# Patient Record
Sex: Female | Born: 1975 | Race: White | Hispanic: No | Marital: Married | Smoking: Former smoker
Health system: Southern US, Community
[De-identification: ages and names within clinical notes are randomized; demographics above are authoritative.]

## PROBLEM LIST (undated history)

## (undated) ENCOUNTER — Inpatient Hospital Stay (HOSPITAL_COMMUNITY): Payer: Self-pay

## (undated) DIAGNOSIS — T7840XA Allergy, unspecified, initial encounter: Secondary | ICD-10-CM

## (undated) DIAGNOSIS — R0602 Shortness of breath: Secondary | ICD-10-CM

## (undated) DIAGNOSIS — R011 Cardiac murmur, unspecified: Secondary | ICD-10-CM

## (undated) DIAGNOSIS — J45909 Unspecified asthma, uncomplicated: Secondary | ICD-10-CM

## (undated) DIAGNOSIS — F419 Anxiety disorder, unspecified: Secondary | ICD-10-CM

## (undated) HISTORY — DX: Allergy, unspecified, initial encounter: T78.40XA

## (undated) HISTORY — DX: Anxiety disorder, unspecified: F41.9

---

## 2009-08-19 ENCOUNTER — Inpatient Hospital Stay (HOSPITAL_COMMUNITY): Admission: AD | Admit: 2009-08-19 | Discharge: 2009-08-22 | Payer: Self-pay | Admitting: Obstetrics and Gynecology

## 2010-05-25 ENCOUNTER — Encounter: Admission: RE | Admit: 2010-05-25 | Discharge: 2010-05-25 | Payer: Self-pay | Admitting: Internal Medicine

## 2011-02-25 LAB — CBC
HCT: 30.6 % — ABNORMAL LOW (ref 36.0–46.0)
Hemoglobin: 10.5 g/dL — ABNORMAL LOW (ref 12.0–15.0)
MCHC: 33.8 g/dL (ref 30.0–36.0)
MCHC: 34.3 g/dL (ref 30.0–36.0)
MCV: 94.8 fL (ref 78.0–100.0)
Platelets: 121 10*3/uL — ABNORMAL LOW (ref 150–400)
Platelets: 166 10*3/uL (ref 150–400)
RDW: 13.3 % (ref 11.5–15.5)
WBC: 10.7 10*3/uL — ABNORMAL HIGH (ref 4.0–10.5)

## 2012-03-08 LAB — OB RESULTS CONSOLE HEPATITIS B SURFACE ANTIGEN: Hepatitis B Surface Ag: NEGATIVE

## 2012-03-08 LAB — OB RESULTS CONSOLE HIV ANTIBODY (ROUTINE TESTING): HIV: NONREACTIVE

## 2012-03-08 LAB — OB RESULTS CONSOLE RUBELLA ANTIBODY, IGM: Rubella: IMMUNE

## 2012-03-08 LAB — OB RESULTS CONSOLE RPR: RPR: NONREACTIVE

## 2012-03-08 LAB — OB RESULTS CONSOLE GC/CHLAMYDIA: Chlamydia: NEGATIVE

## 2012-08-21 ENCOUNTER — Other Ambulatory Visit (HOSPITAL_COMMUNITY): Payer: Self-pay | Admitting: Obstetrics and Gynecology

## 2012-08-21 ENCOUNTER — Encounter (HOSPITAL_COMMUNITY): Payer: Self-pay

## 2012-08-21 ENCOUNTER — Ambulatory Visit (HOSPITAL_COMMUNITY)
Admission: RE | Admit: 2012-08-21 | Discharge: 2012-08-21 | Disposition: A | Discharge status: Home / Self Care | Payer: PRIVATE HEALTH INSURANCE | Source: Ambulatory Visit | Attending: Obstetrics and Gynecology | Admitting: Obstetrics and Gynecology

## 2012-08-21 DIAGNOSIS — R059 Cough, unspecified: Secondary | ICD-10-CM | POA: Insufficient documentation

## 2012-08-21 DIAGNOSIS — R05 Cough: Secondary | ICD-10-CM

## 2012-08-22 ENCOUNTER — Inpatient Hospital Stay (HOSPITAL_COMMUNITY)
Admission: AD | Admit: 2012-08-22 | Discharge: 2012-08-22 | Disposition: A | Discharge status: Home / Self Care | Payer: PRIVATE HEALTH INSURANCE | Source: Ambulatory Visit | Attending: Obstetrics and Gynecology | Admitting: Obstetrics and Gynecology

## 2012-08-22 ENCOUNTER — Encounter (HOSPITAL_COMMUNITY): Payer: Self-pay | Admitting: *Deleted

## 2012-08-22 DIAGNOSIS — J209 Acute bronchitis, unspecified: Secondary | ICD-10-CM | POA: Insufficient documentation

## 2012-08-22 DIAGNOSIS — R059 Cough, unspecified: Secondary | ICD-10-CM | POA: Insufficient documentation

## 2012-08-22 DIAGNOSIS — R05 Cough: Secondary | ICD-10-CM | POA: Insufficient documentation

## 2012-08-22 DIAGNOSIS — O99891 Other specified diseases and conditions complicating pregnancy: Secondary | ICD-10-CM | POA: Insufficient documentation

## 2012-08-22 DIAGNOSIS — R0602 Shortness of breath: Secondary | ICD-10-CM | POA: Insufficient documentation

## 2012-08-22 MED ORDER — BENZONATATE 200 MG PO CAPS: 200.0000 mg | ORAL_CAPSULE | Freq: Three times a day (TID) | ORAL | Status: DC | PRN

## 2012-08-22 NOTE — MAU Provider Note (Signed)
Chief Complaint:  Cough   First Provider Initiated Contact with Patient 08/22/12 2232      HPI: Alyssa Perez is a 36 y.o. G3P1011 at 5w2dwho presents to maternity admissions reporting shortness of breath. She's had URI for about a month and coughing continuously for about 2 weeks. Daughter also has cough for a month. She was initially seen at Urgent Care 4 days ago and started on Z-pack which she is taking as directed. She had a prenatal visit yesterday and was sent over here for chest x-ray which was negative yesterday. She denies fever chills or chest pain. Cough is nonproductive. Took cough suppressant only once because it makes her sleepy. Denies contractions, leakage of fluid or vaginal bleeding. Good fetal movement.   Pregnancy Course: Essentially uncomplicated  Past Medical History: History reviewed. No pertinent past medical history.  Past obstetric history: OB History    Grav Para Term Preterm Abortions TAB SAB Ect Mult Living   3 1 1  0 1 0 1 0 0 1     # Outc Date GA Lbr Len/2nd Wgt Sex Del Anes PTL Lv   1 TRM 9/10   6lb10oz(3.005kg) F LTCS EPI No Yes   2 SAB            3 CUR               Past Surgical History: Past Surgical History  Procedure Date  . Cesarean section     Family History: History reviewed. No pertinent family history.  Social History: History  Substance Use Topics  . Smoking status: Never Smoker   . Smokeless tobacco: Not on file  . Alcohol Use: No    Allergies:  Allergies  Allergen Reactions  . Sulfa Antibiotics Other (See Comments)    Patient does not recall reaction, but states that "it put her in hospital."  . Penicillins Rash    Meds:  Prescriptions prior to admission  Medication Sig Dispense Refill  . azithromycin (ZITHROMAX) 250 MG tablet Take 250 mg by mouth daily.      . Prenatal Vit-Fe Fumarate-FA (PRENATAL MULTIVITAMIN) TABS Take 1 tablet by mouth at bedtime.      . promethazine-codeine (PHENERGAN WITH CODEINE) 6.25-10  MG/5ML syrup Take 5 mLs by mouth every 4 (four) hours as needed. For cough        ROS: Pertinent findings in history of present illness.  Physical Exam  Blood pressure 109/63, pulse 95, temperature 98.5 F (36.9 C), temperature source Oral, resp. rate 20, height 5\' 5"  (1.651 m), weight 73.936 kg (163 lb), SpO2 99.00%. GENERAL: Well-developed, well-nourished female in no acute distress.  HEENT: normocephalic HEART: RRR, no murmur RESP: normal effort, CTA bilaterally ABDOMEN: Soft, non-tender, gravid appropriate for gestational age EXTREMITIES: Nontender, no edema NEURO: alert and oriented  FHT:  Baseline 135 , moderate variability, accelerations present, no decelerations Contractions: irreg, mild   Labs: No results found for this or any previous visit (from the past 24 hour(s)).No results found for this or any previous visit (from the past 72 hour(s)).  Imaging:  Dg Chest 2 View  08/21/2012  *RADIOLOGY REPORT*  Clinical Data: Chronic cough  CHEST - 2 VIEW  Comparison: None.  Findings: There is no edema or consolidation.  The heart size and pulmonary vascularity are normal.  No adenopathy.  No bone lesions.  IMPRESSION: No edema or consolidation.   Original Report Authenticated By: Arvin Collard. WOODRUFF III, M.D.       Assessment: 1. Bronchitis,  acute   G3P1011 at [redacted]w[redacted]d  Plan: C/W Dr. Henderson Cloud. Add Tessalon and discharge home with reassurance. Increase rest and fluids.  F/U in office as scheduled and prn.     Medication List     As of 08/22/2012 10:53 PM    STOP taking these medications         promethazine-codeine 6.25-10 MG/5ML syrup   Commonly known as: PHENERGAN with CODEINE      TAKE these medications         azithromycin 250 MG tablet   Commonly known as: ZITHROMAX   Take 250 mg by mouth daily.      benzonatate 200 MG capsule   Commonly known as: TESSALON   Take 1 capsule (200 mg total) by mouth 3 (three) times daily as needed for cough.      prenatal  multivitamin Tabs   Take 1 tablet by mouth at bedtime.         Danae Orleans, CNM 08/22/2012 10:37 PM

## 2012-08-22 NOTE — MAU Note (Signed)
Pt reports she had cold symptoms about 3 weeks ago, started coughing 2 weeks ago, mostly dry cough. Over last few days coughing has increased. Had a chest x-ray yesterday, was told it was "clear", is on day 4 of a Z-Pack. Denies fever.

## 2012-09-11 ENCOUNTER — Encounter (HOSPITAL_COMMUNITY): Payer: Self-pay | Admitting: Pharmacist

## 2012-09-20 ENCOUNTER — Encounter (HOSPITAL_COMMUNITY): Payer: Self-pay

## 2012-09-20 ENCOUNTER — Encounter (HOSPITAL_COMMUNITY)
Admission: RE | Admit: 2012-09-20 | Discharge: 2012-09-20 | Disposition: A | Discharge status: Home / Self Care | Payer: PRIVATE HEALTH INSURANCE | Source: Ambulatory Visit | Attending: Obstetrics and Gynecology | Admitting: Obstetrics and Gynecology

## 2012-09-20 HISTORY — DX: Shortness of breath: R06.02

## 2012-09-20 HISTORY — DX: Cardiac murmur, unspecified: R01.1

## 2012-09-20 LAB — SURGICAL PCR SCREEN: Staphylococcus aureus: NEGATIVE

## 2012-09-20 LAB — CBC
HCT: 34.7 % — ABNORMAL LOW (ref 36.0–46.0)
Hemoglobin: 11.6 g/dL — ABNORMAL LOW (ref 12.0–15.0)
MCH: 31 pg (ref 26.0–34.0)
Platelets: 146 10*3/uL — ABNORMAL LOW (ref 150–400)
RBC: 3.74 MIL/uL — ABNORMAL LOW (ref 3.87–5.11)

## 2012-09-20 LAB — RPR: RPR Ser Ql: NONREACTIVE

## 2012-09-20 LAB — TYPE AND SCREEN: ABO/RH(D): O POS

## 2012-09-20 NOTE — Patient Instructions (Addendum)
Your procedure is scheduled on:10/04/12  Enter through the Main Entrance at :12noon Pick up desk phone and dial [phone] and inform us of your arrival.  Please call [phone] if you have any problems the morning of surgery.  Remember: Do not eat after midnight: Wed. Clear liquids ok until 0930 am Thursday   Take these meds the morning of surgery with a sip of water:none  DO NOT wear jewelry, eye make-up, lipstick,body lotion, or dark fingernail polish. Do not shave for 48 hours prior to surgery.  If you are to be admitted after surgery, leave suitcase in car until your room has been assigned. Patients discharged on the day of surgery will not be allowed to drive home.

## 2012-09-25 ENCOUNTER — Encounter (HOSPITAL_COMMUNITY): Payer: Self-pay | Admitting: *Deleted

## 2012-09-25 ENCOUNTER — Inpatient Hospital Stay (HOSPITAL_COMMUNITY): Payer: PRIVATE HEALTH INSURANCE | Admitting: Anesthesiology

## 2012-09-25 ENCOUNTER — Encounter (HOSPITAL_COMMUNITY): Payer: Self-pay | Admitting: Anesthesiology

## 2012-09-25 ENCOUNTER — Inpatient Hospital Stay (HOSPITAL_COMMUNITY)
Admission: AD | Admit: 2012-09-25 | Discharge: 2012-09-27 | DRG: 775 | Disposition: A | Discharge status: Home / Self Care | Payer: PRIVATE HEALTH INSURANCE | Source: Ambulatory Visit | Attending: Obstetrics and Gynecology | Admitting: Obstetrics and Gynecology

## 2012-09-25 DIAGNOSIS — Z22330 Carrier of Group B streptococcus: Secondary | ICD-10-CM

## 2012-09-25 DIAGNOSIS — Z01812 Encounter for preprocedural laboratory examination: Secondary | ICD-10-CM

## 2012-09-25 DIAGNOSIS — O99892 Other specified diseases and conditions complicating childbirth: Secondary | ICD-10-CM | POA: Diagnosis present

## 2012-09-25 DIAGNOSIS — O34219 Maternal care for unspecified type scar from previous cesarean delivery: Secondary | ICD-10-CM

## 2012-09-25 LAB — CBC
HCT: 36.2 % (ref 36.0–46.0)
MCV: 90.5 fL (ref 78.0–100.0)
Platelets: 159 10*3/uL (ref 150–400)
RBC: 4 MIL/uL (ref 3.87–5.11)
RDW: 13.6 % (ref 11.5–15.5)
WBC: 17.7 10*3/uL — ABNORMAL HIGH (ref 4.0–10.5)

## 2012-09-25 MED ORDER — IBUPROFEN 600 MG PO TABS
600.0000 mg | ORAL_TABLET | Freq: Four times a day (QID) | ORAL | Status: DC | PRN
  Administered 2012-09-26: 600 mg via ORAL
  Filled 2012-09-25: qty 1

## 2012-09-25 MED ORDER — OXYCODONE-ACETAMINOPHEN 5-325 MG PO TABS: 1.0000 | ORAL_TABLET | ORAL | Status: DC | PRN

## 2012-09-25 MED ORDER — PENICILLIN G POTASSIUM 5000000 UNITS IJ SOLR: 5.0000 10*6.[IU] | Freq: Once | INTRAVENOUS | Status: DC

## 2012-09-25 MED ORDER — LIDOCAINE HCL (PF) 1 % IJ SOLN
30.0000 mL | INTRAMUSCULAR | Status: DC | PRN
  Filled 2012-09-25: qty 30

## 2012-09-25 MED ORDER — CITRIC ACID-SODIUM CITRATE 334-500 MG/5ML PO SOLN: 30.0000 mL | ORAL | Status: DC | PRN

## 2012-09-25 MED ORDER — VANCOMYCIN HCL IN DEXTROSE 1-5 GM/200ML-% IV SOLN
1000.0000 mg | Freq: Two times a day (BID) | INTRAVENOUS | Status: DC
  Filled 2012-09-25 (×2): qty 200

## 2012-09-25 MED ORDER — LACTATED RINGERS IV SOLN
INTRAVENOUS | Status: DC
  Administered 2012-09-25: 22:00:00 via INTRAVENOUS
  Administered 2012-09-25: 125 mL/h via INTRAVENOUS

## 2012-09-25 MED ORDER — OXYTOCIN 40 UNITS IN LACTATED RINGERS INFUSION - SIMPLE MED
62.5000 mL/h | INTRAVENOUS | Status: DC
  Filled 2012-09-25: qty 1000

## 2012-09-25 MED ORDER — PHENYLEPHRINE 40 MCG/ML (10ML) SYRINGE FOR IV PUSH (FOR BLOOD PRESSURE SUPPORT)
80.0000 ug | PREFILLED_SYRINGE | INTRAVENOUS | Status: DC | PRN
  Filled 2012-09-25: qty 5

## 2012-09-25 MED ORDER — ONDANSETRON HCL 4 MG/2ML IJ SOLN
4.0000 mg | Freq: Four times a day (QID) | INTRAMUSCULAR | Status: DC | PRN
  Administered 2012-09-25: 4 mg via INTRAVENOUS
  Filled 2012-09-25: qty 2

## 2012-09-25 MED ORDER — FENTANYL 2.5 MCG/ML BUPIVACAINE 1/10 % EPIDURAL INFUSION (WH - ANES)
14.0000 mL/h | INTRAMUSCULAR | Status: DC
  Administered 2012-09-25 (×2): 14 mL/h via EPIDURAL
  Filled 2012-09-25 (×2): qty 125

## 2012-09-25 MED ORDER — PHENYLEPHRINE 40 MCG/ML (10ML) SYRINGE FOR IV PUSH (FOR BLOOD PRESSURE SUPPORT): 80.0000 ug | PREFILLED_SYRINGE | INTRAVENOUS | Status: DC | PRN

## 2012-09-25 MED ORDER — CEFAZOLIN SODIUM 1-5 GM-% IV SOLN
1.0000 g | Freq: Three times a day (TID) | INTRAVENOUS | Status: DC
  Administered 2012-09-25: 1 g via INTRAVENOUS
  Filled 2012-09-25 (×3): qty 50

## 2012-09-25 MED ORDER — LIDOCAINE HCL (PF) 1 % IJ SOLN
INTRAMUSCULAR | Status: DC | PRN
  Administered 2012-09-25 (×2): 5 mL

## 2012-09-25 MED ORDER — VANCOMYCIN HCL 500 MG IV SOLR: 500.0000 mg | Freq: Two times a day (BID) | INTRAVENOUS | Status: DC

## 2012-09-25 MED ORDER — PENICILLIN G POTASSIUM 5000000 UNITS IJ SOLR: 2.5000 10*6.[IU] | INTRAVENOUS | Status: DC

## 2012-09-25 MED ORDER — CEFAZOLIN SODIUM-DEXTROSE 2-3 GM-% IV SOLR
2.0000 g | Freq: Once | INTRAVENOUS | Status: AC
  Administered 2012-09-25: 2 g via INTRAVENOUS
  Filled 2012-09-25: qty 50

## 2012-09-25 MED ORDER — LACTATED RINGERS IV SOLN: 500.0000 mL | INTRAVENOUS | Status: DC | PRN

## 2012-09-25 MED ORDER — OXYTOCIN BOLUS FROM INFUSION
500.0000 mL | INTRAVENOUS | Status: DC
  Administered 2012-09-25: 500 mL via INTRAVENOUS

## 2012-09-25 MED ORDER — LACTATED RINGERS IV SOLN
500.0000 mL | Freq: Once | INTRAVENOUS | Status: AC
  Administered 2012-09-25: 500 mL via INTRAVENOUS

## 2012-09-25 MED ORDER — ACETAMINOPHEN 325 MG PO TABS: 650.0000 mg | ORAL_TABLET | ORAL | Status: DC | PRN

## 2012-09-25 MED ORDER — DIPHENHYDRAMINE HCL 50 MG/ML IJ SOLN: 12.5000 mg | INTRAMUSCULAR | Status: DC | PRN

## 2012-09-25 MED ORDER — EPHEDRINE 5 MG/ML INJ: 10.0000 mg | INTRAVENOUS | Status: DC | PRN

## 2012-09-25 MED ORDER — EPHEDRINE 5 MG/ML INJ
10.0000 mg | INTRAVENOUS | Status: DC | PRN
  Filled 2012-09-25: qty 4

## 2012-09-25 NOTE — Progress Notes (Signed)
SVD - (VBAC) of vigerous female infant w/ apgars of 9,9.  Placenta delivered spontaneous w/ 3VC.   2nd degree lac & left labial lac repaired w/ 3-0 vicryl rapide.  Fundus firm.  EBL 400cc .  Mom and baby doing well in LDR.

## 2012-09-25 NOTE — Progress Notes (Signed)
Pt comfortable w/ epidural  FHT reassuring  Toco Q3-5 Cvx c/c/+1  A/P:  Will start pushing

## 2012-09-25 NOTE — Anesthesia Preprocedure Evaluation (Signed)

## 2012-09-25 NOTE — MAU Note (Signed)
Dr. Renaldo Fiddler notified of pt U/C's pattern, vaginal exam, and hx.  Orders received for pt to ambulate for 1 hour to see if pt will make further cervical change.

## 2012-09-25 NOTE — MAU Note (Signed)
Pt states U/C's woke her up at 0300 this am and are now every 5 min.  Bloody show/spotting, no ROM.  Good fetal movement.

## 2012-09-25 NOTE — Progress Notes (Signed)
Pt comfortable w/ epidural.    FHT reassuring Toco Q5-7 Cvx 5/90/-2 AROM - blood tinged fluid  A/P:  Exp mngt

## 2012-09-25 NOTE — MAU Note (Signed)
Ctx's since 0300, about 5 min.  Have gotten stronger and closer. Blood tinged d/c. No water leaking.  No problems with preg.  For VBAC, primary c/s for breech.

## 2012-09-25 NOTE — Progress Notes (Signed)
Pt presents w/ c/o increasing ctx. Feeling more discomfort and increasing frequency. No lof or vb.   Past History - See hollister  GBS +  H/o c-section, breech.    AF, VSS  + FHT  Toco Q2-3  Gen - uncomfortable w/ ctx  Abd - gravid, NT  Cvx 4/90/-2 (3cm in office yesterday)   A/P: Labor, desires TOLAC  Admit  Epidural prn  Exp mngt  Vancomycin - PCN allergy, clinda resistance  

## 2012-09-25 NOTE — MAU Note (Signed)
Was 3/70 yesterday.

## 2012-09-25 NOTE — Anesthesia Procedure Notes (Signed)
Epidural Patient location during procedure: OB Start time: 09/25/2012 2:53 PM  Staffing Anesthesiologist: Brayton Caves R Performed by: anesthesiologist   Preanesthetic Checklist Completed: patient identified, site marked, surgical consent, pre-op evaluation, timeout performed, IV checked, risks and benefits discussed and monitors and equipment checked  Epidural Patient position: sitting Prep: site prepped and draped and DuraPrep Patient monitoring: continuous pulse ox and blood pressure Approach: midline Injection technique: LOR air and LOR saline  Needle:  Needle type: Tuohy  Needle gauge: 17 G Needle length: 9 cm and 9 Needle insertion depth: 5 cm cm Catheter type: closed end flexible Catheter size: 19 Gauge Catheter at skin depth: 10 cm Test dose: negative  Assessment Events: blood not aspirated, injection not painful, no injection resistance, negative IV test and no paresthesia  Additional Notes Patient identified.  Risk benefits discussed including failed block, incomplete pain control, headache, nerve damage, paralysis, blood pressure changes, nausea, vomiting, reactions to medication both toxic or allergic, and postpartum back pain.  Patient expressed understanding and wished to proceed.  All questions were answered.  Sterile technique used throughout procedure and epidural site dressed with sterile barrier dressing. No paresthesia or other complications noted.The patient did not experience any signs of intravascular injection such as tinnitus or metallic taste in mouth nor signs of intrathecal spread such as rapid motor block. Please see nursing notes for vital signs.

## 2012-09-26 ENCOUNTER — Encounter (HOSPITAL_COMMUNITY): Payer: Self-pay | Admitting: Family Medicine

## 2012-09-26 LAB — CBC
HCT: 31.8 % — ABNORMAL LOW (ref 36.0–46.0)
Hemoglobin: 10.5 g/dL — ABNORMAL LOW (ref 12.0–15.0)
MCH: 30.3 pg (ref 26.0–34.0)
MCHC: 33 g/dL (ref 30.0–36.0)
MCV: 91.6 fL (ref 78.0–100.0)

## 2012-09-26 MED ORDER — ONDANSETRON HCL 4 MG/2ML IJ SOLN: 4.0000 mg | INTRAMUSCULAR | Status: DC | PRN

## 2012-09-26 MED ORDER — DIPHENHYDRAMINE HCL 25 MG PO CAPS: 25.0000 mg | ORAL_CAPSULE | Freq: Four times a day (QID) | ORAL | Status: DC | PRN

## 2012-09-26 MED ORDER — OXYCODONE-ACETAMINOPHEN 5-325 MG PO TABS: 1.0000 | ORAL_TABLET | ORAL | Status: DC | PRN

## 2012-09-26 MED ORDER — PRENATAL MULTIVITAMIN CH
1.0000 | ORAL_TABLET | Freq: Every day | ORAL | Status: DC
  Administered 2012-09-26 – 2012-09-27 (×2): 1 via ORAL
  Filled 2012-09-26 (×2): qty 1

## 2012-09-26 MED ORDER — LANOLIN HYDROUS EX OINT: TOPICAL_OINTMENT | CUTANEOUS | Status: DC | PRN

## 2012-09-26 MED ORDER — DIBUCAINE 1 % RE OINT: 1.0000 "application " | TOPICAL_OINTMENT | RECTAL | Status: DC | PRN

## 2012-09-26 MED ORDER — IBUPROFEN 600 MG PO TABS
600.0000 mg | ORAL_TABLET | Freq: Four times a day (QID) | ORAL | Status: DC
  Administered 2012-09-26 – 2012-09-27 (×5): 600 mg via ORAL
  Filled 2012-09-26 (×5): qty 1

## 2012-09-26 MED ORDER — SIMETHICONE 80 MG PO CHEW: 80.0000 mg | CHEWABLE_TABLET | ORAL | Status: DC | PRN

## 2012-09-26 MED ORDER — PRENATAL MULTIVITAMIN CH: 1.0000 | ORAL_TABLET | Freq: Every day | ORAL | Status: DC

## 2012-09-26 MED ORDER — TETANUS-DIPHTH-ACELL PERTUSSIS 5-2.5-18.5 LF-MCG/0.5 IM SUSP: 0.5000 mL | Freq: Once | INTRAMUSCULAR | Status: DC

## 2012-09-26 MED ORDER — WITCH HAZEL-GLYCERIN EX PADS: 1.0000 "application " | MEDICATED_PAD | CUTANEOUS | Status: DC | PRN

## 2012-09-26 MED ORDER — MEDROXYPROGESTERONE ACETATE 150 MG/ML IM SUSP: 150.0000 mg | INTRAMUSCULAR | Status: DC | PRN

## 2012-09-26 MED ORDER — MEASLES, MUMPS & RUBELLA VAC ~~LOC~~ INJ
0.5000 mL | INJECTION | Freq: Once | SUBCUTANEOUS | Status: DC
  Filled 2012-09-26: qty 0.5

## 2012-09-26 MED ORDER — ONDANSETRON HCL 4 MG PO TABS: 4.0000 mg | ORAL_TABLET | ORAL | Status: DC | PRN

## 2012-09-26 MED ORDER — BENZOCAINE-MENTHOL 20-0.5 % EX AERO
1.0000 "application " | INHALATION_SPRAY | CUTANEOUS | Status: DC | PRN
  Filled 2012-09-26: qty 56

## 2012-09-26 MED ORDER — SENNOSIDES-DOCUSATE SODIUM 8.6-50 MG PO TABS
2.0000 | ORAL_TABLET | Freq: Every day | ORAL | Status: DC
  Administered 2012-09-26: 2 via ORAL

## 2012-09-26 NOTE — Progress Notes (Signed)
Post Partum Day 1 Subjective: no complaints, up ad lib, voiding, tolerating PO and + flatus  Objective: Blood pressure 100/60, pulse 88, temperature 97.7 F (36.5 C), temperature source Oral, resp. rate 18, height 5' 7.5" (1.715 m), weight 75.751 kg (167 lb), SpO2 100.00%, unknown if currently breastfeeding.  Physical Exam:  General: alert and cooperative Lochia: appropriate Uterine Fundus: firm Incision: perineum intact DVT Evaluation: No evidence of DVT seen on physical exam. No significant calf/ankle edema.   Basename 09/26/12 0535 09/25/12 1345  HGB 10.5* 12.5  HCT 31.8* 36.2    Assessment/Plan: Plan for discharge tomorrow   LOS: 1 day   CURTIS,CAROL G 09/26/2012, 8:55 AM

## 2012-09-26 NOTE — Anesthesia Postprocedure Evaluation (Signed)
  Anesthesia Post-op Note  Patient: Alyssa Perez  Procedure(s) Performed: * No procedures listed *  Patient Location: PACU and Mother/Baby  Anesthesia Type:Epidural  Level of Consciousness: awake, alert , oriented and patient cooperative  Airway and Oxygen Therapy: Patient Spontanous Breathing  Post-op Pain: mild  Post-op Assessment: Post-op Vital signs reviewed, Patient's Cardiovascular Status Stable, Respiratory Function Stable, Patent Airway, No signs of Nausea or vomiting and Adequate PO intake  Post-op Vital Signs: Reviewed and stable  Complications: No apparent anesthesia complications

## 2012-09-27 DIAGNOSIS — O34219 Maternal care for unspecified type scar from previous cesarean delivery: Secondary | ICD-10-CM

## 2012-09-27 MED ORDER — GUAIFENESIN 100 MG/5ML PO SOLN
5.0000 mL | ORAL | Status: DC | PRN
  Administered 2012-09-27: 100 mg via ORAL
  Filled 2012-09-27: qty 15

## 2012-09-27 MED ORDER — OXYCODONE-ACETAMINOPHEN 7.5-500 MG PO TABS: 1.0000 | ORAL_TABLET | ORAL | Status: DC | PRN

## 2012-09-27 NOTE — Discharge Summary (Signed)
Obstetric Discharge Summary Reason for Admission: onset of labor Prenatal Procedures: none Intrapartum Procedures: spontaneous vaginal delivery Postpartum Procedures: none Complications-Operative and Postpartum: second degree perineal laceration Hemoglobin  Date Value Range Status  09/26/2012 10.5* 12.0 - 15.0 g/dL Final     HCT  Date Value Range Status  09/26/2012 31.8* 36.0 - 46.0 % Final    Physical Exam:  General: alert Lochia: appropriate Uterine Fundus: firm Incision: na DVT Evaluation: No evidence of DVT seen on physical exam.  Discharge Diagnoses: Term Pregnancy-delivered  Discharge Information: Date: 09/27/2012 Activity: pelvic rest Diet: routine Medications: Percocet Condition: stable Instructions: refer to practice specific booklet Discharge to: home   Newborn Data: Live born female  Birth Weight: 7 lb 7 oz (3374 g) APGAR: 9, 9  Home with mother.  Junnie Loschiavo S 09/27/2012, 7:39 AM

## 2012-10-01 NOTE — H&P (Signed)
Pt presents w/ c/o increasing ctx. Feeling more discomfort and increasing frequency. No lof or vb.   Past History - See hollister  GBS +  H/o c-section, breech.    AF, VSS  + FHT  Toco Q2-3  Gen - uncomfortable w/ ctx  Abd - gravid, NT  Cvx 4/90/-2 (3cm in office yesterday)   A/P: Labor, desires TOLAC  Admit  Epidural prn  Exp mngt  Vancomycin - PCN allergy, clinda resistance

## 2012-10-04 ENCOUNTER — Inpatient Hospital Stay (HOSPITAL_COMMUNITY)
Admission: AD | Admit: 2012-10-04 | Payer: PRIVATE HEALTH INSURANCE | Source: Ambulatory Visit | Admitting: Obstetrics and Gynecology

## 2012-10-04 SURGERY — Surgical Case
Anesthesia: Regional

## 2014-09-22 ENCOUNTER — Encounter (HOSPITAL_COMMUNITY): Payer: Self-pay | Admitting: Family Medicine

## 2014-12-17 ENCOUNTER — Other Ambulatory Visit: Payer: Self-pay | Admitting: Obstetrics and Gynecology

## 2014-12-17 DIAGNOSIS — N6459 Other signs and symptoms in breast: Secondary | ICD-10-CM

## 2014-12-19 ENCOUNTER — Other Ambulatory Visit: Payer: Self-pay | Admitting: Obstetrics and Gynecology

## 2014-12-19 ENCOUNTER — Ambulatory Visit
Admission: RE | Admit: 2014-12-19 | Discharge: 2014-12-19 | Disposition: A | Discharge status: Home / Self Care | Payer: PRIVATE HEALTH INSURANCE | Source: Ambulatory Visit | Attending: Obstetrics and Gynecology | Admitting: Obstetrics and Gynecology

## 2014-12-19 ENCOUNTER — Other Ambulatory Visit: Payer: Self-pay

## 2014-12-19 DIAGNOSIS — N6459 Other signs and symptoms in breast: Secondary | ICD-10-CM

## 2014-12-24 ENCOUNTER — Other Ambulatory Visit: Payer: PRIVATE HEALTH INSURANCE

## 2015-09-24 ENCOUNTER — Other Ambulatory Visit: Payer: Self-pay | Admitting: Allergy and Immunology

## 2015-09-24 ENCOUNTER — Encounter (INDEPENDENT_AMBULATORY_CARE_PROVIDER_SITE_OTHER): Payer: Self-pay

## 2015-09-24 ENCOUNTER — Ambulatory Visit
Admission: RE | Admit: 2015-09-24 | Discharge: 2015-09-24 | Disposition: A | Discharge status: Home / Self Care | Payer: PRIVATE HEALTH INSURANCE | Source: Ambulatory Visit | Attending: Allergy and Immunology | Admitting: Allergy and Immunology

## 2015-09-24 DIAGNOSIS — J209 Acute bronchitis, unspecified: Secondary | ICD-10-CM

## 2015-12-21 ENCOUNTER — Encounter (HOSPITAL_COMMUNITY): Payer: Self-pay

## 2015-12-21 ENCOUNTER — Inpatient Hospital Stay (HOSPITAL_COMMUNITY)
Admission: AD | Admit: 2015-12-21 | Discharge: 2015-12-21 | Disposition: A | Discharge status: Home / Self Care | Payer: PRIVATE HEALTH INSURANCE | Source: Ambulatory Visit | Attending: Obstetrics & Gynecology | Admitting: Obstetrics & Gynecology

## 2015-12-21 DIAGNOSIS — R300 Dysuria: Secondary | ICD-10-CM | POA: Diagnosis present

## 2015-12-21 DIAGNOSIS — Z880 Allergy status to penicillin: Secondary | ICD-10-CM | POA: Insufficient documentation

## 2015-12-21 DIAGNOSIS — N390 Urinary tract infection, site not specified: Secondary | ICD-10-CM | POA: Insufficient documentation

## 2015-12-21 HISTORY — DX: Unspecified asthma, uncomplicated: J45.909

## 2015-12-21 LAB — URINALYSIS, ROUTINE W REFLEX MICROSCOPIC
BILIRUBIN URINE: NEGATIVE
GLUCOSE, UA: NEGATIVE mg/dL
HGB URINE DIPSTICK: NEGATIVE
KETONES UR: 15 mg/dL — AB
Nitrite: NEGATIVE
PH: 6.5 (ref 5.0–8.0)
PROTEIN: NEGATIVE mg/dL
Specific Gravity, Urine: 1.025 (ref 1.005–1.030)

## 2015-12-21 LAB — URINE MICROSCOPIC-ADD ON: RBC / HPF: NONE SEEN RBC/hpf (ref 0–5)

## 2015-12-21 LAB — POCT PREGNANCY, URINE: Preg Test, Ur: NEGATIVE

## 2015-12-21 MED ORDER — PHENAZOPYRIDINE HCL 200 MG PO TABS: 200.0000 mg | ORAL_TABLET | Freq: Three times a day (TID) | ORAL | Status: DC

## 2015-12-21 MED ORDER — VANCOMYCIN HCL IN DEXTROSE 1-5 GM/200ML-% IV SOLN
1000.0000 mg | Freq: Once | INTRAVENOUS | Status: AC
Start: 2015-12-21 — End: 2015-12-21
  Administered 2015-12-21: 1000 mg via INTRAVENOUS
  Filled 2015-12-21: qty 200

## 2015-12-21 MED ORDER — LACTATED RINGERS IV SOLN
INTRAVENOUS | Status: DC
  Administered 2015-12-21: 125 mL/h via INTRAVENOUS

## 2015-12-21 NOTE — MAU Provider Note (Signed)
History     CSN: [id number]  Arrival date and time: 12/21/15 1191   First Provider Initiated Contact with Patient 12/21/15 1940       Chief Complaint  Patient presents with  . Dysuria   Alyssa Perez is a 40 y.o. female who was sent from office for IV fluids & antibiotics.   Urinary Tract Infection  Episode onset: since mid December. The problem occurs every urination. The problem has been gradually worsening. The quality of the pain is described as aching. The pain is at a severity of 8/10. There has been no fever. She is sexually active. There is no history of pyelonephritis. Associated symptoms include frequency, nausea and urgency. Pertinent negatives include no chills, discharge, flank pain, hematuria, possible pregnancy or vomiting. She has tried antibiotics (uribel) for the symptoms. The treatment provided no relief. There is no history of catheterization, kidney stones, recurrent UTIs, a single kidney or a urological procedure.    OB History    Gravida Para Term Preterm AB TAB SAB Ectopic Multiple Living   0 1 0 1 0 0 2      Past Medical History  Diagnosis Date  . Heart murmur     as a child  . Shortness of breath     during URI  . Asthma     Past Surgical History  Procedure Laterality Date  . Cesarean section      Family History  Problem Relation Age of Onset  . Other Neg Hx     Social History  Substance Use Topics  . Smoking status: Never Smoker   . Smokeless tobacco: Never Used  . Alcohol Use: No    Allergies:  Allergies  Allergen Reactions  . Sulfa Antibiotics Anaphylaxis and Other (See Comments)    Patient does not recall reaction, but states that "it put her in hospital."  . Macrobid [Nitrofurantoin [employer]  . Penicillins Rash    Childhood reaction, pt is able to take ceftin    Prescriptions prior to admission  Medication Sig Dispense Refill Last Dose  . albuterol (PROVENTIL) (2.5 MG/3ML) 0.083% nebulizer solution  Take 2.5 mg by nebulization every 6 (six) hours as needed for wheezing or shortness of breath.   Past Month at Unknown time  . levofloxacin (LEVAQUIN) 750 MG tablet Take 750 mg by mouth daily.   12/21/2015 at Unknown time  . Multiple Vitamin (MULTIVITAMIN WITH MINERALS) TABS tablet Take 1 tablet by mouth daily.   12/21/2015 at Unknown time  . oxyCODONE-acetaminophen (PERCOCET) 7.5-500 MG per tablet Take 1 tablet by mouth every 4 (four) hours as needed for pain. 30 tablet 0   . Prenatal Vit-Fe Fumarate-FA (PRENATAL MULTIVITAMIN) TABS Take 1 tablet by mouth at bedtime.   09/24/2012 at Unknown  . pseudoephedrine (SUDAFED) 30 MG tablet Take 30 mg by mouth every 4 (four) hours as needed. For congestion/cold   Past Week at Unknown    Review of Systems  Constitutional: Negative for fever and chills.  Gastrointestinal: Positive for nausea and abdominal pain. Negative for vomiting, diarrhea and constipation.  Genitourinary: Positive for urgency and frequency. Negative for dysuria, hematuria and flank pain.  Musculoskeletal: Negative.    Physical Exam   Blood pressure 118/68, pulse 79, temperature 98.6 F (37 C), temperature source Oral, resp. rate 20, weight 142 lb 6.4 oz (64.592 kg), last menstrual period 12/04/2015, unknown if currently breastfeeding.  Physical Exam  Constitutional: She appears well-developed and well-nourished. No distress.  HENT:  Head: Normocephalic and atraumatic.  Cardiovascular: Normal rate, regular rhythm and normal heart sounds.   Respiratory: Effort normal and breath sounds normal. No respiratory distress. She has no wheezes.  GI: Soft. Bowel sounds are normal. She exhibits no distension. There is tenderness in the suprapubic area. There is no CVA tenderness.  Musculoskeletal: Normal range of motion.  Skin: Skin is warm and dry. No rash noted. She is not diaphoretic. No erythema.  Psychiatric: She has a normal mood and affect. Her behavior is normal. Judgment and thought  content normal.    MAU Course  Procedures Results for orders placed or performed during the hospital encounter of 12/21/15 (from the past 24 hour(s))  Urinalysis, Routine w reflex microscopic (not at [facility])     Status: Abnormal   Collection Time: 12/21/15  7:00 PM  Result Value Ref Range   Color, Urine YELLOW YELLOW   APPearance CLEAR CLEAR   Specific Gravity, Urine 1.025 1.005 - 1.030   pH 6.5 5.0 - 8.0   Glucose, UA NEGATIVE NEGATIVE mg/dL   Hgb urine dipstick NEGATIVE NEGATIVE   Bilirubin Urine NEGATIVE NEGATIVE   Ketones, ur 15 (A) NEGATIVE mg/dL   Protein, ur NEGATIVE NEGATIVE mg/dL   Nitrite NEGATIVE NEGATIVE   Leukocytes, UA SMALL (A) NEGATIVE  Urine microscopic-add on     Status: Abnormal   Collection Time: 12/21/15  7:00 PM  Result Value Ref Range   Squamous Epithelial / LPF 0-5 (A) NONE SEEN   WBC, UA 0-5 0 - 5 WBC/hpf   RBC / HPF NONE SEEN 0 - 5 RBC/hpf   Bacteria, UA RARE (A) NONE SEEN  Pregnancy, urine POC     Status: None   Collection Time: 12/21/15  7:43 PM  Result Value Ref Range   Preg Test, Ur NEGATIVE NEGATIVE    MDM UPT negative Patient is half way through her third round of antibiotics for UTI since mid December. States her symptoms are worsening. Pyelonephritis not likely d/t no flank pain, CVA tenderness, fever/chills, hematuria. 3 S/w Dr. Langston Masker. IV vancomycin 1 gm x 1 dose. Discharge home with rx for pyridium to f/u with Dr. Renaldo Fiddler on Wednesday.   Assessment and Plan  A: 1. UTI (lower urinary tract infection)     P: Discharge home Rx pyridium Stop taking levaquin Call office in morning to schedule f/u appointment for Wednesday  Judeth Horn, NP  12/21/2015, 7:39 PM

## 2015-12-21 NOTE — MAU Note (Signed)
Has a bladder infection.  Had one since mid- Dec. During 2nd round of Macrobid (had 3 pills left), she broke out in hives. Went back to office last Monday.  5 days later was told the culture was positive, started on levofloxacin, half way through.  Was called at 5 tonight, and told to come to women's. Frequency and urgency, lower abd pain.

## 2015-12-21 NOTE — Discharge Instructions (Signed)

## 2016-04-05 ENCOUNTER — Other Ambulatory Visit: Payer: Self-pay

## 2016-04-05 DIAGNOSIS — Z1231 Encounter for screening mammogram for malignant neoplasm of breast: Secondary | ICD-10-CM

## 2016-04-28 ENCOUNTER — Ambulatory Visit
Admission: RE | Admit: 2016-04-28 | Discharge: 2016-04-28 | Disposition: A | Discharge status: Home / Self Care | Payer: PRIVATE HEALTH INSURANCE | Source: Ambulatory Visit

## 2016-04-28 ENCOUNTER — Other Ambulatory Visit: Payer: Self-pay | Admitting: Obstetrics and Gynecology

## 2016-04-28 DIAGNOSIS — Z1231 Encounter for screening mammogram for malignant neoplasm of breast: Secondary | ICD-10-CM

## 2016-05-11 ENCOUNTER — Other Ambulatory Visit: Payer: Self-pay | Admitting: Obstetrics and Gynecology

## 2016-05-11 DIAGNOSIS — R928 Other abnormal and inconclusive findings on diagnostic imaging of breast: Secondary | ICD-10-CM

## 2016-05-20 ENCOUNTER — Ambulatory Visit
Admission: RE | Admit: 2016-05-20 | Discharge: 2016-05-20 | Disposition: A | Discharge status: Home / Self Care | Payer: PRIVATE HEALTH INSURANCE | Source: Ambulatory Visit | Attending: Obstetrics and Gynecology | Admitting: Obstetrics and Gynecology

## 2016-05-20 DIAGNOSIS — R928 Other abnormal and inconclusive findings on diagnostic imaging of breast: Secondary | ICD-10-CM

## 2017-03-28 ENCOUNTER — Other Ambulatory Visit: Payer: Self-pay | Admitting: Allergy and Immunology

## 2017-03-28 ENCOUNTER — Ambulatory Visit
Admission: RE | Admit: 2017-03-28 | Discharge: 2017-03-28 | Disposition: A | Discharge status: Home / Self Care | Payer: Managed Care, Other (non HMO) | Source: Ambulatory Visit | Attending: Allergy and Immunology | Admitting: Allergy and Immunology

## 2017-03-28 DIAGNOSIS — R059 Cough, unspecified: Secondary | ICD-10-CM

## 2017-03-28 DIAGNOSIS — R05 Cough: Secondary | ICD-10-CM

## 2017-04-06 ENCOUNTER — Institutional Professional Consult (permissible substitution): Payer: Managed Care, Other (non HMO) | Admitting: Internal Medicine

## 2017-05-22 ENCOUNTER — Other Ambulatory Visit: Payer: Self-pay | Admitting: Obstetrics and Gynecology

## 2017-05-22 DIAGNOSIS — Z1231 Encounter for screening mammogram for malignant neoplasm of breast: Secondary | ICD-10-CM

## 2017-05-30 ENCOUNTER — Ambulatory Visit
Admission: RE | Admit: 2017-05-30 | Discharge: 2017-05-30 | Disposition: A | Discharge status: Home / Self Care | Payer: 59 | Source: Ambulatory Visit | Attending: Obstetrics and Gynecology | Admitting: Obstetrics and Gynecology

## 2017-05-30 DIAGNOSIS — Z1231 Encounter for screening mammogram for malignant neoplasm of breast: Secondary | ICD-10-CM

## 2017-06-15 ENCOUNTER — Ambulatory Visit (INDEPENDENT_AMBULATORY_CARE_PROVIDER_SITE_OTHER): Payer: 59 | Admitting: Internal Medicine

## 2017-06-15 ENCOUNTER — Encounter: Payer: Self-pay | Admitting: Internal Medicine

## 2017-06-15 ENCOUNTER — Other Ambulatory Visit: Payer: 59

## 2017-06-15 VITALS — BP 110/62 | HR 64 | Ht 68.5 in | Wt 145.0 lb

## 2017-06-15 DIAGNOSIS — Z8709 Personal history of other diseases of the respiratory system: Secondary | ICD-10-CM

## 2017-06-15 DIAGNOSIS — J411 Mucopurulent chronic bronchitis: Secondary | ICD-10-CM | POA: Insufficient documentation

## 2017-06-15 DIAGNOSIS — J387 Other diseases of larynx: Secondary | ICD-10-CM | POA: Insufficient documentation

## 2017-06-15 DIAGNOSIS — R6889 Other general symptoms and signs: Secondary | ICD-10-CM | POA: Insufficient documentation

## 2017-06-15 DIAGNOSIS — R0989 Other specified symptoms and signs involving the circulatory and respiratory systems: Secondary | ICD-10-CM | POA: Insufficient documentation

## 2017-06-15 DIAGNOSIS — Z889 Allergy status to unspecified drugs, medicaments and biological substances status: Secondary | ICD-10-CM | POA: Diagnosis not present

## 2017-06-15 NOTE — Progress Notes (Signed)
Subjective:    Patient ID: Alyssa Perez, female    DOB: [birth year], 41 y.o.   MRN: [id number]  PCP Alyssa Seashore, MD   HPI   IOV 06/15/2017  Chief Complaint  Patient presents with  . Pulmonary Consult    Pt referred by Alyssa Perez for recurrent cough. Pt states she occassionally gets colds and pt states they always wind up as a chest colds. Pt states she gets these chest colds at least 3 times per year.    41 year old female. 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 In 1996 when she was in college she had an episode of pneumonia. Since then she's having repeated episodes of acute bronchitis at least once a year. In the last Perez years this has accelerated to 3 or 4 episodes each year. She feels she never seems to get a break from her recurrent acute bronchitis. 2 years ago she was referred to Alyssa Perez allergist and allergy testing was negative but again this spring 2018 because of recurrent bronchitis she was evaluated by her again and apparently she was strongly allergy test positive. She's been started on allergy shots. She does not know if this is helping. In addition she's been on Brio for the last Perez years but this has made no impact in her symptoms. She also tells me that she has excess induced asthma and when she runs she gets very short of breath and NSAIDs up in Alyssa Perez office. Each episode of acute bronchitis is characterized by onset of a cold and a sensation of submandibular gland swelling followed by this descent of  the symptoms into the upper chest with severe bilateral upper chest pain followed by further descent into the chest and then cough with yellow sputum. She will require prednisone and sometimes antibiotics to clear this. Following this the cough can last a Perez weeks but occasionally especially the most recent episode in spring 2018 lasted 4 weeks. In between  episodes she is well but she thinks she has constant acid reflux because she clears her throat a lot [she reports a strong family history of acid reflux) and feels there is something I her throast (dad also has same and clears his throat a lot) . She is frustrated by her symptoms. One time she thought there was pleurisy and apparently was given aspirin.    History reviewed from Alyssa Perez chart the following day: Her most recent visit with Alyssa Perez the allergist was on 04/05/2017. The notes also indicate that in 2014 allergy test was negative.. On the states she had a nitric oxide test with 11 ppb normal result. She also had skin testing that is positive for grass pollens, tree pollens, cat dander, mixed feathers, dust mites, cockroach and mold spores. Food allergies were negative. She was given allergen avoidance  instructions she had spirometry and I personally visualized the trace it was normal with an FEV1 of 3.06 L/90%  CLINICAL DATA:  Productive cough and abnormal chest sounds when exercising. Posterior chest pain as well. Unresponsive to medication. History of asthma.  EXAM: CHEST  2 VIE W -   COMPARISON:  Chest x-ray of September 24, 2015  FINDINGS: The lungs are well-expanded and clear. The heart and pulmonary vascularity are normal. The mediastinum is normal in width. There is no pleural effusion. The bony thorax exhibits no acute abnormality.  IMPRESSION: There is no pneumonia,  CHF, nor other acute cardiopulmonary abnormality.   Electronically Signed   By: Alyssa  Perez M.D.   On: 03/28/2017 09:49 this chest x-ray was personally visualized  Results for Alyssa, Perez (MRN [id number]) as of 06/15/2017 11:39  Ref. Range 09/25/2012 13:45 09/26/2012 05:35 12/21/2015 19:43  Hemoglobin Latest Ref Range: 12.0 - 15.0 g/dL 12.5 10.5 (L)    Other lab review from Alyssa Perez primary care physician shows normal C-reactive protein in May 2018, normal liver function test,  normal hemoglobin, normal.  Th   has a past medical history of Asthma; Heart murmur; and Shortness of breath.   reports that she has quit smoking. She has a 0.40 pack-year smoking history. She has never used smokeless tobacco.  Past Surgical History:  Procedure Laterality Date  . CESAREAN SECTION      Allergies  Allergen Reactions  . Sulfa Antibiotics Anaphylaxis and Other (See Comments)    Patient does not recall reaction, but states that "it put her in hospital." could not walk became very weak   . Macrobid [Nitrofurantoin [employer]  . Penicillins Rash    Childhood reaction, pt is able to take ceftin Has patient had a PCN reaction causing immediate rash, facial/tongue/throat swelling, SOB or lightheadedness with hypotension: unknown Has patient had a PCN reaction causing severe rash involving mucus membranes or skin necrosis: unknown Has patient had a PCN reaction that required hospitalization unknown Has patient had a PCN reaction occurring within the last 10 years: no If all of the above answers are "NO", then may proceed with Cephalosporin use.     Immunization History  Administered Date(s) Administered  . Influenza Split 09/04/2012  . Influenza,inj,Quad PF,36+ Mos 08/21/2016  . Tdap 06/29/2012    Family History  Problem Relation Age of Onset  . Breast cancer Maternal Grandmother   . Bladder Cancer Father   . Lung cancer Paternal Grandfather   . Other Neg Hx      Current Outpatient Prescriptions:  .  Albuterol Sulfate (PROAIR RESPICLICK) 458 (90 Base) MCG/ACT AEPB, Inhale 1 puff into the lungs every 4 (four) hours as needed (exercise)., Disp: , Rfl:  .  cetirizine (ZYRTEC) 10 MG tablet, Take 10 mg by mouth daily., Disp: , Rfl:  .  Fluticasone Furoate-Vilanterol (BREO ELLIPTA) 100-25 MCG/INH AEPB, Inhale 1 puff into the lungs daily., Disp: , Rfl:  .  Lactobacillus (PROBIOTIC ACIDOPHILUS PO), Take by mouth., Disp: , Rfl:  .  Multiple Vitamin  (MULTIVITAMIN WITH MINERALS) TABS tablet, Take 1 tablet by mouth daily., Disp: , Rfl:  .  pentosan polysulfate (ELMIRON) 100 MG capsule, Take 200 mg by mouth 2 (two) times daily., Disp: , Rfl:     Review of Systems  Constitutional: Negative for fever and unexpected weight change.  HENT: Negative for congestion, dental problem, ear pain, nosebleeds, postnasal drip, rhinorrhea, sinus pressure, sneezing, sore throat and trouble swallowing.   Eyes: Negative for redness and itching.  Respiratory: Negative for cough, chest tightness, shortness of breath and wheezing.   Cardiovascular: Negative for palpitations and leg swelling.  Gastrointestinal: Negative for nausea and vomiting.  Genitourinary: Negative for dysuria.  Musculoskeletal: Negative for joint swelling.  Skin: Negative for rash.  Neurological: Negative for headaches.  Hematological: Does not bruise/bleed easily.  Psychiatric/Behavioral: Negative for dysphoric mood. The patient is not nervous/anxious.        Objective:   Physical Exam  Constitutional: She is oriented to person, place, and time. She appears well-developed and well-nourished. No distress.  HENT:  Head: Normocephalic and atraumatic.  Right Ear: External ear normal.  Left Ear: External ear normal.  Mouth/Throat: Oropharynx is clear and moist. No oropharyngeal exudate.  occ clearing of throat Mild redness in throats  Eyes: Pupils are equal, round, and reactive to light. Conjunctivae and EOM are normal. Right eye exhibits no discharge. Left eye exhibits no discharge. No scleral icterus.  Neck: Normal range of motion. Neck supple. No JVD present. No tracheal deviation present. No thyromegaly present.  Cardiovascular: Normal rate, regular rhythm, normal heart sounds and intact distal pulses.  Exam reveals no gallop and no friction rub.   No murmur heard. Pulmonary/Chest: Effort normal and breath sounds normal. No respiratory distress. She has no wheezes. She has no  rales. She exhibits no tenderness.  Abdominal: Soft. Bowel sounds are normal. She exhibits no distension and no mass. There is no tenderness. There is no rebound and no guarding.  Musculoskeletal: Normal range of motion. She exhibits no edema or tenderness.  Lymphadenopathy:    She has no cervical adenopathy.  Neurological: She is alert and oriented to person, place, and time. She has normal reflexes. No cranial nerve deficit. She exhibits normal muscle tone. Coordination normal.  Skin: Skin is warm and dry. No rash noted. She is not diaphoretic. No erythema. No pallor.  Psychiatric: She has a normal mood and affect. Her behavior is normal. Judgment and thought content normal.  Vitals reviewed.  Vitals:   06/15/17 1112  BP: 110/62  Pulse: 64  SpO2: 98%  Weight: 145 lb (65.8 kg)  Height: 5' 8.5" (1.74 m)    Estimated body mass index is 21.73 kg/m as calculated from the following:   Height as of this encounter: 5' 8.5" (1.74 m).   Weight as of this encounter: 145 lb (65.8 kg).        Assessment & Plan:     ICD-10-CM   1. 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 History of pleurisy Z87.09   3. History of seasonal allergies Z88.9   4. Chronic throat clearing R68.89   5. Irritable larynx J38.7    Perplexing consideration of symptoms. On the face of that this sounds very typical of asthma however she is not having improvement with Brio and her latest nitric oxide testing was normal. The pleurisy raises the question of autoimmune disorder such as lupus but she does not have any other systemic manifestation and therefore the pretest probably ready is otherwise low. The other possibility is that she either has allergen and/or acid reflux plus normal recurrent bronchitis related irritable larynx syndrome or cough neuropathy. This is characterized by chronic throat clearing and a feeling of something in her throat. Basically heightened neural-response which then makes her very  susceptible to exaggerated symptoms for normal or even only mildly abnormal stimuli such as respiratory infection. She seems to agree with this. We did discuss and agree that given the refractory nature of symptoms that  We would do an extensive workup . MEanwhile Rx for GERD but continue breo (noted breo being dpi can irriate her throat and will consider changing this at fu)  Do HRCT chest Do ANA, DS-DNA, ESR, RF, CCP, ssa/ssb, scl-70, CK for autoimmune disease Will query Dr Alyssa Perez notes and review it Give time for allergy shots to work\ Continue breo for now Recommend OTC zegerid 53m daily on empty stomach for 1 month trials  Followup Will call with results Return in 4-6 weeks to see me (early PM 11.30 appt  or overbook 8.45am or late morning if needed) Depending on course will discuss strategies to tackle irritable throast or consider getting Immune Globuloin workup   Dr. Brand Males, M.D., [facility].C.P Pulmonary and Critical Care Medicine Staff Physician [place] [facility] Pager: [phone], If no answer or between  15:00h - 7:00h: call [phone]  06/15/2017 12:13 PM

## 2017-06-15 NOTE — Patient Instructions (Addendum)
ICD-10-CM   1. 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 History of pleurisy Z87.09   3. History of seasonal allergies Z88.9   4. Chronic throat clearing R68.89   5. Irritable larynx J38.7     Do HRCT chest Do ANA, DS-DNA, ESR, RF, CCP, ssa/ssb, scl-70, CK for autoimmune disease Will query Dr Lilli Few notes and review it Give time for allergy shots to work Continue breo for now Recommend OTC zegerid 63m daily on empty stomach for 1 month trials  Followup Will call with results Return in 4-6 weeks to see me (early PM 11.30 appt or overbook 8.45am or late morning if needed) Depending on course will discuss strategies to tackle irritable throast

## 2017-06-16 ENCOUNTER — Other Ambulatory Visit (INDEPENDENT_AMBULATORY_CARE_PROVIDER_SITE_OTHER): Payer: 59

## 2017-06-16 ENCOUNTER — Telehealth: Payer: Self-pay | Admitting: Internal Medicine

## 2017-06-16 DIAGNOSIS — Z8709 Personal history of other diseases of the respiratory system: Secondary | ICD-10-CM | POA: Diagnosis not present

## 2017-06-16 LAB — ANTI-SCLERODERMA ANTIBODY: Scleroderma (Scl-70) (ENA) Antibody, IgG: <1

## 2017-06-16 LAB — CYCLIC CITRUL PEPTIDE ANTIBODY, IGG: Cyclic Citrullin Peptide Ab: 45 Units — ABNORMAL HIGH

## 2017-06-16 LAB — ANTI-DNA ANTIBODY, DOUBLE-STRANDED: DS DNA AB: 3 [IU]/mL

## 2017-06-16 LAB — SJOGRENS SYNDROME-B EXTRACTABLE NUCLEAR ANTIBODY: SSB (La) (ENA) Antibody, IgG: <1

## 2017-06-16 LAB — RHEUMATOID FACTOR: Rhuematoid fact SerPl-aCnc: <14 IU/mL (ref <14)

## 2017-06-16 LAB — ANA: ANA: NEGATIVE

## 2017-06-16 LAB — SJOGRENS SYNDROME-A EXTRACTABLE NUCLEAR ANTIBODY: SSA (RO) (ENA) ANTIBODY, IGG: NEGATIVE

## 2017-06-16 LAB — SEDIMENTATION RATE: SED RATE: 14 mm/h (ref 0–20)

## 2017-06-16 NOTE — Telephone Encounter (Signed)
lmtcb for pt.  

## 2017-06-16 NOTE — Telephone Encounter (Signed)
Let Alyssa Perez know that I reviewed Dr Lilli Few note from mid may 2018 and allergy test results. No change in our plan. Is same. In terms of her autoimmune blood work from [employer] - RF is negative, ESR is normal. THis is good news. REst pending and could be mid-week

## 2017-06-16 NOTE — Telephone Encounter (Signed)
Pt returning call to elise and can be reached @ [phone].Caren GriffinsStanley A Dalton

## 2017-06-19 NOTE — Telephone Encounter (Signed)
lmtcb for pt.  

## 2017-06-20 ENCOUNTER — Inpatient Hospital Stay: Admission: RE | Admit: 2017-06-20 | Payer: 59 | Source: Ambulatory Visit

## 2017-06-20 NOTE — Telephone Encounter (Signed)
Patient returning call - she can be reached at [phone] -pr

## 2017-06-20 NOTE — Telephone Encounter (Signed)
Called and spoke to pt. Informed her of the results and recs per MR. Pt verbalized understanding and denied any further questions or concerns at this time.   

## 2017-06-26 ENCOUNTER — Ambulatory Visit (INDEPENDENT_AMBULATORY_CARE_PROVIDER_SITE_OTHER)
Admission: RE | Admit: 2017-06-26 | Discharge: 2017-06-26 | Disposition: A | Discharge status: Home / Self Care | Payer: 59 | Source: Ambulatory Visit | Attending: Internal Medicine | Admitting: Internal Medicine

## 2017-06-26 ENCOUNTER — Telehealth: Payer: Self-pay | Admitting: Internal Medicine

## 2017-06-26 DIAGNOSIS — Z8709 Personal history of other diseases of the respiratory system: Secondary | ICD-10-CM

## 2017-06-26 NOTE — Telephone Encounter (Signed)
CT- clean without ILD  Autoimmune - slight elevation with CCP - ? Significance. Would need rheum referral  I called her 10:04 AM 06/26/2017 to give results - but LMTCB. If she calls back, please page me to call her  Dr. Kalman ShanMurali Poseidon Pam, M.D., [facility].C.P Pulmonary and Critical Care Medicine Staff Physician [facility] [place] [facility] Pager: [phone], If no answer or between  15:00h - 7:00h: call [phone]  06/26/2017 10:05 AM      autoimune  - Results for Alyssa BrazenREICHARD, Alyssa S (MRN [id number]) as of 06/26/2017 10:02  Ref. Range 06/15/2017 12:31  Anit Nuclear Antibody(ANA) Latest Ref Range: NEGATIVE  NEG  Cyclic Citrullin Peptide Ab Latest Units: Units 45 (H)  ds DNA Ab Latest Units: IU/mL 3  RA Latex Turbid. Latest Ref Range: <14 IU/mL <14  SSA (Ro) (ENA) Antibody, IgG Latest Ref Range: <1.0 NEG AI  <1.0 NEG  SSB (La) (ENA) Antibody, IgG Latest Ref Range: <1.0 NEG AI  <1.0 NEG  Scleroderma (Scl-70) (ENA) Antibody, IgG Latest Ref Range: <1.0 NEG AI  <1.0 NEG

## 2017-06-27 ENCOUNTER — Telehealth: Payer: Self-pay | Admitting: Internal Medicine

## 2017-06-27 NOTE — Telephone Encounter (Signed)
LMTCB. If she calls back - please call me (my pager battery is low)  Dr. Kalman ShanMurali Keamber Macfadden, M.D., [facility].C.P Pulmonary and Critical Care Medicine Staff Physician [facility] [place] [facility] Pager: [phone], If no answer or between  15:00h - 7:00h: call [phone]  06/27/2017 1:24 PM

## 2017-06-27 NOTE — Telephone Encounter (Signed)
Gave patient result: she is feeling better. She says that fatigue from a  Year ago has improved though travel can result in fatigue. 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 Discussed rheum v followup: resolved will repeat CCP antibody in few months. Next visit mid sep 2018

## 2017-06-27 NOTE — Telephone Encounter (Signed)
Will close encounter as another has been created.

## 2017-06-27 NOTE — Telephone Encounter (Signed)
MR pt is returning your call. Please advise. Thanks.  Robynn Panelise is aware as well, as MR is in clinic today.

## 2017-06-27 NOTE — Telephone Encounter (Signed)
Pt is returning MR's call. Pt request call back at [phone]. Robynn Panelise is going to text MR, as he is currently in a meeting in his office.   MR please advise. Thanks.

## 2017-06-27 NOTE — Telephone Encounter (Signed)
Patient is returning phone call [phone].

## 2017-08-08 ENCOUNTER — Ambulatory Visit (INDEPENDENT_AMBULATORY_CARE_PROVIDER_SITE_OTHER): Payer: 59 | Admitting: Internal Medicine

## 2017-08-08 ENCOUNTER — Encounter: Payer: Self-pay | Admitting: Internal Medicine

## 2017-08-08 ENCOUNTER — Other Ambulatory Visit: Payer: 59

## 2017-08-08 VITALS — BP 116/78 | HR 65 | Ht 68.0 in | Wt 146.8 lb

## 2017-08-08 DIAGNOSIS — R5382 Chronic fatigue, unspecified: Secondary | ICD-10-CM

## 2017-08-08 DIAGNOSIS — L659 Nonscarring hair loss, unspecified: Secondary | ICD-10-CM

## 2017-08-08 DIAGNOSIS — J411 Mucopurulent chronic bronchitis: Secondary | ICD-10-CM | POA: Diagnosis not present

## 2017-08-08 NOTE — Patient Instructions (Addendum)
Hair loss - do testosterone and thyroid panel as requested by your Ob Dr Candice Camp  Chronic fatigue - recheck CCP antibody  - check 14.3.2 ETA antibiody - will call with results;  - this issue could be post viral fatigue  Bronchitis, mucopurulent recurrent (HCC) -glad better and controlled  Followup Dependent on test results

## 2017-08-08 NOTE — Progress Notes (Signed)
Subjective:     Patient ID: Alyssa Perez, female   DOB: 1976-06-07, 41 y.o.   MRN: [id number]  HPI  PCP Alyssa Fick, MD   HPI   IOV 06/15/2017  Chief Complaint  Patient presents with  . Pulmonary Consult    Pt referred by Dr. Nicholos Perez for recurrent cough. Pt states she occassionally gets colds and pt states they always wind up as a chest colds. Pt states she gets these chest colds at least 3 times per year.    41 year old female. 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 In 1996 when she was in college she had an episode of pneumonia. Since then she's having repeated episodes of acute bronchitis at least once a year. In the last few years this has accelerated to 3 or 4 episodes each year. She feels she never seems to get a break from her recurrent acute bronchitis. 2 years ago she was referred to Dr. Barnetta Perez allergist and allergy testing was negative but again this spring 2018 because of recurrent bronchitis she was evaluated by her again and apparently she was strongly allergy test positive. She's been started on allergy shots. She does not know if this is helping. In addition she's been on Brio for the last few years but this has made no impact in her symptoms. She also tells me that she has excess induced asthma and when she runs she gets very short of breath and NSAIDs up in Dr. Shon Perez office. Each episode of acute bronchitis is characterized by onset of a cold and a sensation of submandibular gland swelling followed by this descent of  the symptoms into the upper chest with severe bilateral upper chest pain followed by further descent into the chest and then cough with yellow sputum. She will require prednisone and sometimes antibiotics to clear this. Following this the cough can last a few weeks but occasionally especially the most recent episode in spring 2018 lasted 4 weeks. In  between episodes she is well but she thinks she has constant acid reflux because she clears her throat a lot [she reports a strong family history of acid reflux) and feels there is something I her throast (dad also has same and clears his throat a lot) . She is frustrated by her symptoms. One time she thought there was pleurisy and apparently was given aspirin.    History reviewed from Dr. Shon Perez chart the following day: Her most recent visit with Dr. Aris Perez the allergist was on 04/05/2017. The notes also indicate that in 2014 allergy test was negative.. On the states she had a nitric oxide test with 11 ppb normal result. She also had skin testing that is positive for grass pollens, tree pollens, cat dander, mixed feathers, dust mites, cockroach and mold spores. Food allergies were negative. She was given allergen avoidance  instructions she had spirometry and I personally visualized the trace it was normal with an FEV1 of 3.06 L/90%  CLINICAL DATA:  Productive cough and abnormal chest sounds when exercising. Posterior chest pain as well. Unresponsive to medication. History of asthma.  EXAM: CHEST  2 VIE W -   COMPARISON:  Chest x-ray of September 24, 2015  FINDINGS: The lungs are well-expanded and clear. The heart and pulmonary vascularity are normal. The mediastinum is normal in width. There is no pleural effusion. The bony thorax exhibits no acute abnormality.  IMPRESSION: There is no  pneumonia, CHF, nor other acute cardiopulmonary abnormality.   Electronically Signed   By: Alyssa  Perez M.D.   On: 03/28/2017 09:49 this chest x-ray was personally visualized  Results for Alyssa Perez (MRN [id number]) as of 06/15/2017 11:39  Ref. Range 09/25/2012 13:45 09/26/2012 05:35 12/21/2015 19:43  Hemoglobin Latest Ref Range: 12.0 - 15.0 g/dL 40.9 81.1 (L)    Other lab review from Dr. Nicholos Perez primary care physician shows normal C-reactive protein in May 2018, normal liver  function test, normal hemoglobin, normal.    OV 08/08/2017  Chief Complaint  Patient presents with  . Follow-up    Pt states that she has been doing good since last visit. Denies any cough, SOB, or CP. Pt has been doing allergy shots which has helped keep her from getting sick.    Alyssa Perez present for followup. Her main issue is recurrent acute bronchitis. Since last seeing me she has had a high-resolution CT chest that was normal. She also feels that her allergy shots have kicked in and she has not been having any acute recurrent bronchitis anymore. Overall she's feeling fine from this standpoint.  Her other main issue is fatigue. We discussed this and a little bit more detail again. She tells me that last summer she had infectious mononucleosis. Since then she's been having fatigue for exertion of activities of daily living. The fatigue has improved since last year but she still does have it. She is modified her lifestyle in terms of limiting her work schedule and her time to bed and getting adequate rest. She is able to do elliptical's exercise but still overall when she travels etc. she does get fatigued. The some of the fatigue came back because she was traveling every week in between the [place] with the family. We did test autoimmune for this and her CCP was moderately positive at 45. We discussed this in the form and we opted to retest this. She's open to having additional testing called 14.3.3 ETA for rheumatoid arthritis.  Other issues that she is having some hair loss this is a recurrence from last summer. She felt it was coincidental to Zegereid ppi. She stopped the PPI but she still has that hair loss. Dr. Candice Perez today and he is asked for thyroid and testosterone panel. I'm happy to order this for him    has a past medical history of Asthma; Heart murmur; and Shortness of breath.   reports that she quit smoking about 20 years ago. Her smoking use included Cigarettes.  She has a 0.40 pack-year smoking history. She has never used smokeless tobacco.  Past Surgical History:  Procedure Laterality Date  . CESAREAN SECTION      Allergies  Allergen Reactions  . Sulfa Antibiotics Anaphylaxis and Other (See Comments)    Patient does not recall reaction, but states that "it put her in hospital." could not walk became very weak   . Macrobid [Nitrofurantoin [employer]  . Penicillins Rash    Childhood reaction, pt is able to take ceftin Has patient had a PCN reaction causing immediate rash, facial/tongue/throat swelling, SOB or lightheadedness with hypotension: unknown Has patient had a PCN reaction causing severe rash involving mucus membranes or skin necrosis: unknown Has patient had a PCN reaction that required hospitalization unknown Has patient had a PCN reaction occurring within the last 10 years: no If all of the above answers are "NO", then may proceed with Cephalosporin use.     Immunization History  Administered Date(s) Administered  . Influenza Split 09/04/2012  . Influenza,inj,Quad PF,6+ Mos 08/21/2016  . Tdap 06/29/2012    Family History  Problem Relation Age of Onset  . Breast cancer Maternal Grandmother   . Bladder Cancer Father   . Lung cancer Paternal Grandfather   . Other Neg Hx      Current Outpatient Prescriptions:  .  Albuterol Sulfate (PROAIR RESPICLICK) 108 (90 Base) MCG/ACT AEPB, Inhale 1 puff into the lungs every 4 (four) hours as needed (exercise)., Disp: , Rfl:  .  cetirizine (ZYRTEC) 10 MG tablet, Take 10 mg by mouth daily., Disp: , Rfl:  .  Fluticasone Furoate-Vilanterol (BREO ELLIPTA) 100-25 MCG/INH AEPB, Inhale 1 puff into the lungs daily., Disp: , Rfl:  .  Multiple Vitamin (MULTIVITAMIN WITH MINERALS) TABS tablet, Take 1 tablet by mouth daily., Disp: , Rfl:  .  pentosan polysulfate (ELMIRON) 100 MG capsule, Take 200 mg by mouth 2 (two) times daily., Disp: , Rfl:  .  EPIPEN 2-PAK 0.3 MG/0.3ML SOAJ injection,  See admin instructions., Disp: , Rfl: 1     Review of Systems     Objective:   Physical Exam  Constitutional: She is oriented to person, place, and time. She appears well-developed and well-nourished. No distress.  HENT:  Head: Normocephalic and atraumatic.  Right Ear: External ear normal.  Left Ear: External ear normal.  Mouth/Throat: Oropharynx is clear and moist. No oropharyngeal exudate.  Eyes: Pupils are equal, round, and reactive to light. Conjunctivae and EOM are normal. Right eye exhibits no discharge. Left eye exhibits no discharge. No scleral icterus.  Neck: Normal range of motion. Neck supple. No JVD present. No tracheal deviation present. No thyromegaly present.  Cardiovascular: Normal rate, regular rhythm, normal heart sounds and intact distal pulses.  Exam reveals no gallop and no friction rub.   No murmur heard. Pulmonary/Chest: Effort normal and breath sounds normal. No respiratory distress. She has no wheezes. She has no rales. She exhibits no tenderness.  Abdominal: Soft. Bowel sounds are normal. She exhibits no distension and no mass. There is no tenderness. There is no rebound and no guarding.  Musculoskeletal: Normal range of motion. She exhibits no edema or tenderness.  Lymphadenopathy:    She has no cervical adenopathy.  Neurological: She is alert and oriented to person, place, and time. She has normal reflexes. No cranial nerve deficit. She exhibits normal muscle tone. Coordination normal.  Skin: Skin is warm and dry. No rash noted. She is not diaphoretic. No erythema. No pallor.  Psychiatric: She has a normal mood and affect. Her behavior is normal. Judgment and thought content normal.  Vitals reviewed.  Vitals:   08/08/17 1224  BP: 116/78  Pulse: 65  SpO2: 97%  Weight: 146 lb 12.8 oz (66.6 kg)  Height:  (1.727 m)        Assessment:       ICD-10-CM   1. Hair loss L65.9 Hormone Panel    Thyroid Panel With TSH    CANCELED: 14.3.3 ETA, Rheum.  Arthritis    CANCELED: Cyclic citrul peptide antibody, IgG    CANCELED: Cyclic citrul peptide antibody, IgG    CANCELED: 14.3.3 ETA, Rheum. Arthritis  2. Chronic fatigue R53.82 Cyclic citrul peptide antibody, IgG    14.3.3 ETA, Rheum. Arthritis    CANCELED: 14.3.3 ETA, Rheum. Arthritis    CANCELED: Cyclic citrul peptide antibody, IgG  3. 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 Kalman Shan, M.D., [facility].C.P Pulmonary and Critical Care Medicine Staff Physician  System [place] [facility] Pager: [phone], If no answer or between  15:00h - 7:00h: call [phone]  08/08/2017 1:19 PM

## 2017-08-09 LAB — THYROID PANEL WITH TSH
FREE THYROXINE INDEX: 2.5 (ref 1.4–3.8)
T3 UPTAKE: 30 % (ref 22–35)
T4 TOTAL: 8.2 ug/dL (ref 5.1–11.9)
TSH: 1.06 mIU/L

## 2017-08-09 LAB — CYCLIC CITRUL PEPTIDE ANTIBODY, IGG: Cyclic Citrullin Peptide Ab: 47 UNITS — ABNORMAL HIGH

## 2017-08-14 LAB — STATUS REPORT

## 2017-08-17 LAB — HORMONE PANEL (T4,TSH,FSH,TESTT,SHBG,DHEA,ETC)
DHEA-Sulfate, LCMS: 29 ug/dL
ESTRADIOL: 139 pg/mL
Estrone Sulfate: 590 ng/dL — ABNORMAL HIGH
FSH: 4 m[IU]/mL
Free Testosterone, Serum: 1.1 pg/mL
Progesterone, Serum: <10 ng/dL
Sex Hormone Binding Globulin: 164.5 nmol/L — ABNORMAL HIGH
TESTOSTERONE, SERUM (TOTAL): 27 ng/dL
Testosterone-% Free: 0.4 % — ABNORMAL LOW

## 2017-08-18 ENCOUNTER — Telehealth: Payer: Self-pay | Admitting: Internal Medicine

## 2017-08-18 DIAGNOSIS — R7989 Other specified abnormal findings of blood chemistry: Secondary | ICD-10-CM

## 2017-08-18 DIAGNOSIS — R5383 Other fatigue: Secondary | ICD-10-CM

## 2017-08-18 DIAGNOSIS — L659 Nonscarring hair loss, unspecified: Secondary | ICD-10-CM

## 2017-08-18 DIAGNOSIS — R768 Other specified abnormal immunological findings in serum: Secondary | ICD-10-CM

## 2017-08-18 NOTE — Telephone Encounter (Signed)
Please apologize to her. Some of the hormone stuff was confusing and sayig cancelled and pending and stuff so I was just waiting. Buth  A) let her know that she should discuss hormone stuff with her Gyn doc who wanted it. Some are saying high and canclend  B) her CCP which is inolvedin rheumatoid is still modestly positive. I d/w a rheumatologist in town and she said sometime people go like this for years without an issue but patient did have fatigue and hair loss concerns and if is interested you can refer her to Dr Corliss Skains or [facility]  Dr. Kalman Shan, M.D., [facility].C.P Pulmonary and Critical Care Medicine Staff Physician [place] [facility] [place] [facility] Pager: [phone], If no answer or between  15:00h - 7:00h: call [phone]  08/18/2017 12:07 PM      Results for ASALEE, BARRETTE (MRN [id number]) as of 08/18/2017 12:05  Ref. Range 08/08/2017 13:12  DHEA-Sulfate, LCMS Latest Units: ug/dL 29  FSH Latest Units: mIU/mL 4.0  Estradiol Latest Units: pg/mL 139  Estrone Sulfate Latest Units: ng/dL 829 (H)  Progesterone Latest Units: ng/dL <56  Testosterone-% Free Latest Units: % 0.4 (L)  Testosterone, Serum (Total) Latest Units: ng/dL 27  Free Testosterone, Serum Latest Units: pg/mL 1.1  Sex Hormone Binding Globulin Latest Units: nmol/L 164.5 (H)  TSH Latest Units: mIU/L 1.06  TSH Latest Units: uU/mL CANCELED  Triiodothyronine (T-3), Serum Latest Units: ng/dL CANCELED  Free T-3 Latest Units: pg/mL CANCELED  Thyroxine (T4) Latest Ref Range: 5.1 - 11.9 mcg/dL 8.2  T4 Latest Units: ug/dL CANCELED  Free Thyroxine Index Latest Ref Range: 1.4 - 3.8  2.5  Cyclic Citrullin Peptide Ab Latest Units: UNITS 47 (H)  Status Report Unknown WILL FOLLOW  T3 Uptake Latest Ref Range: 22 - 35 % 30

## 2017-08-18 NOTE — Telephone Encounter (Signed)
MR pt is calling about her lab results.  Please advise. Thanks

## 2017-08-18 NOTE — Telephone Encounter (Signed)
ATC pt, no answer. Left message for pt to call back.  

## 2017-08-21 NOTE — Telephone Encounter (Signed)
Called and spoke to pt. Informed her of the results and recs per MR. Pt verbalized understanding and requests results be sent to GYN, Dr. Rana Snare - this has been done. Pt also requested referral to rheum be sent to Dr. Dierdre Forth, this order has been placed. Pt verbalized understanding and denied any further questions or concerns at this time.    The labs that were cancelled looks as though there was not enough serum to collect the labs within the Hormone Panel. The labs that were cancelled reads, "Comment: Quantity was not sufficient for analysis." - Pt aware.

## 2017-08-23 ENCOUNTER — Telehealth: Payer: Self-pay | Admitting: Internal Medicine

## 2017-08-23 NOTE — Telephone Encounter (Signed)
Per pt's chart, Alyssa Perez did route pt's lab results via biscom  Called spoke with patient and apologized for the delay but explained results have been faxed.  Informed patient will physically fax results and resend electronically  Pt aware to contact the office if Dr Rana Snare still have difficulty receiving results  Results faxed to Dr Rana Snare at [phone] Results routed via biscom Nothing further needed at this time; will sign off

## 2017-08-29 ENCOUNTER — Telehealth: Payer: Self-pay | Admitting: Internal Medicine

## 2017-08-29 NOTE — Telephone Encounter (Signed)
Spoke with patient. She received a copy of her labs but was not able to read all of the results. Went over the actual numbers with patient. She verbalized understanding. Nothing else needed at time of call.

## 2018-05-15 ENCOUNTER — Other Ambulatory Visit: Payer: Self-pay | Admitting: Obstetrics and Gynecology

## 2018-05-15 DIAGNOSIS — Z1231 Encounter for screening mammogram for malignant neoplasm of breast: Secondary | ICD-10-CM

## 2018-06-11 ENCOUNTER — Ambulatory Visit
Admission: RE | Admit: 2018-06-11 | Discharge: 2018-06-11 | Disposition: A | Discharge status: Home / Self Care | Payer: PRIVATE HEALTH INSURANCE | Source: Ambulatory Visit | Attending: Obstetrics and Gynecology | Admitting: Obstetrics and Gynecology

## 2018-06-11 DIAGNOSIS — Z1231 Encounter for screening mammogram for malignant neoplasm of breast: Secondary | ICD-10-CM

## 2018-07-24 IMAGING — CT CT CHEST HIGH RESOLUTION W/O CM
2 of 5 series · 15 of 36 positions shown, 18 images · non-contrast
Comparison: Chest radiograph 03/28/2017.

CLINICAL DATA: Recurrent bronchitis for several years, shortness of
breath with exertion. Exercise-induced asthma.

EXAM:
CT CHEST WITHOUT CONTRAST
TECHNIQUE: Multidetector CT imaging of the chest was performed following the
standard protocol without intravenous contrast. High resolution
imaging of the lungs, as well as inspiratory and expiratory imaging,
was performed.

[Series 2: high resolution · axial · 0.63mm/px · z∈[-327,-31]mm · 12 of 164 slices shown, 15 images]
[im 8/164  mediastinal]
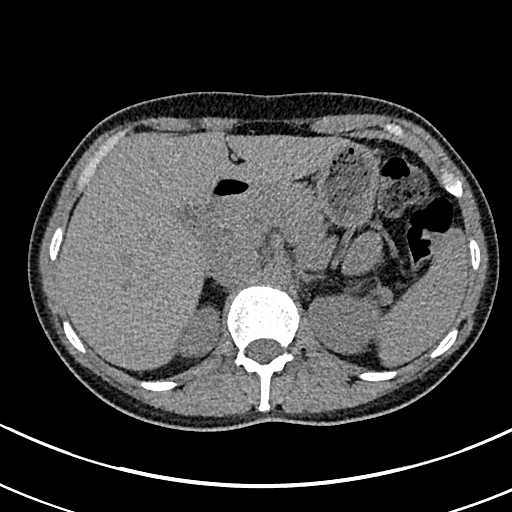
[im 8/164  lung]
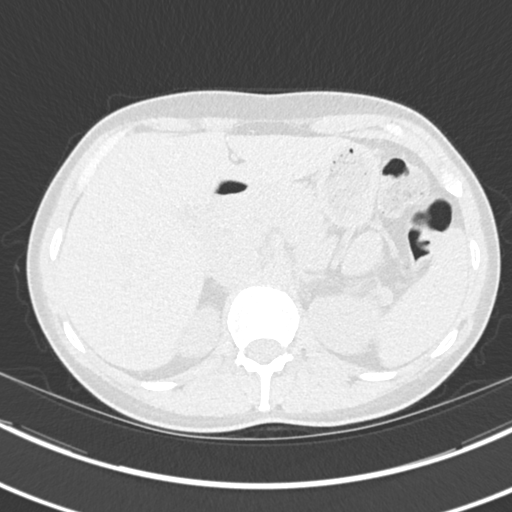
[im 23/164  lung]
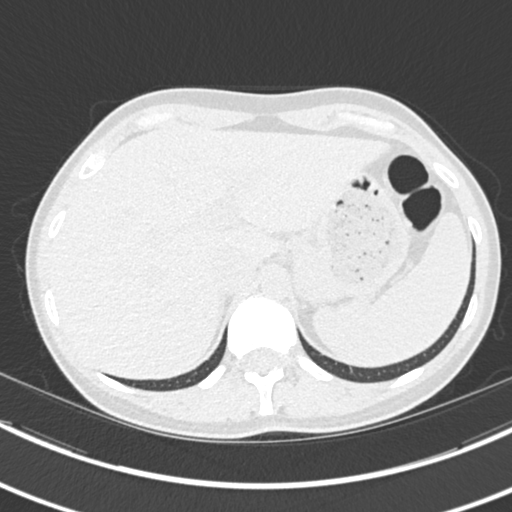
[im 38/164  lung]
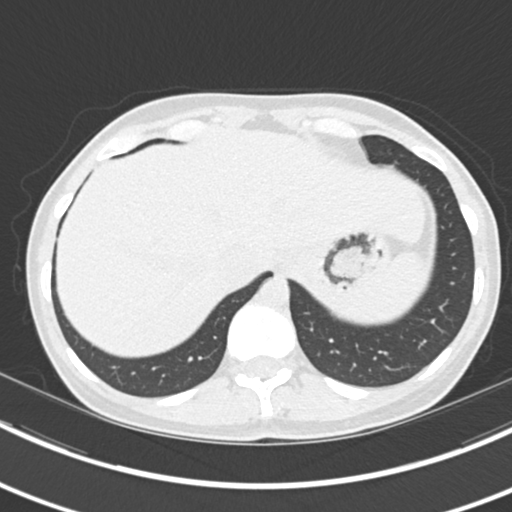
[im 52/164  lung]
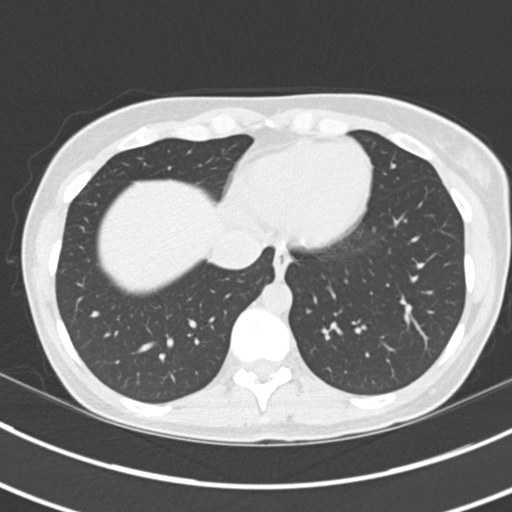
[im 60/164  mediastinal]
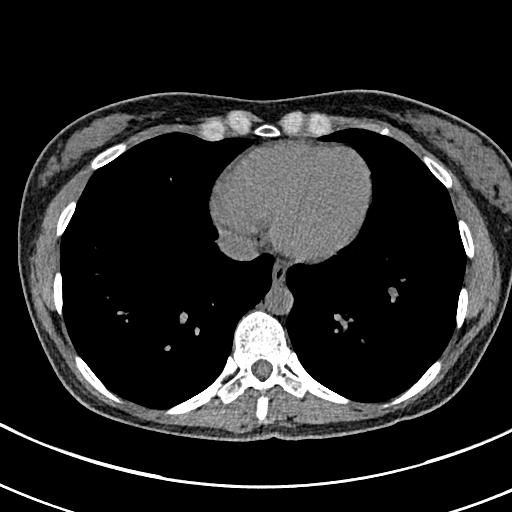
[im 60/164  lung]
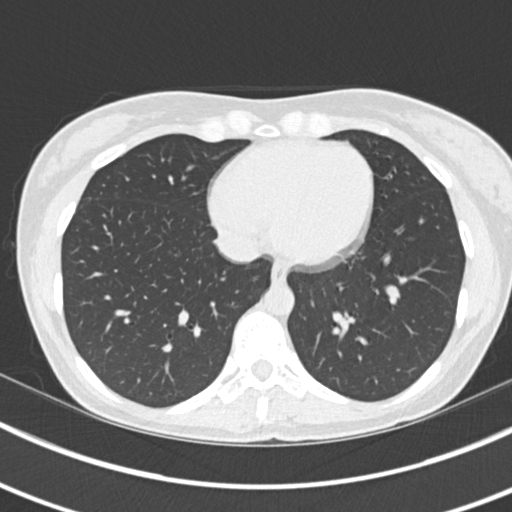
[im 75/164  lung]
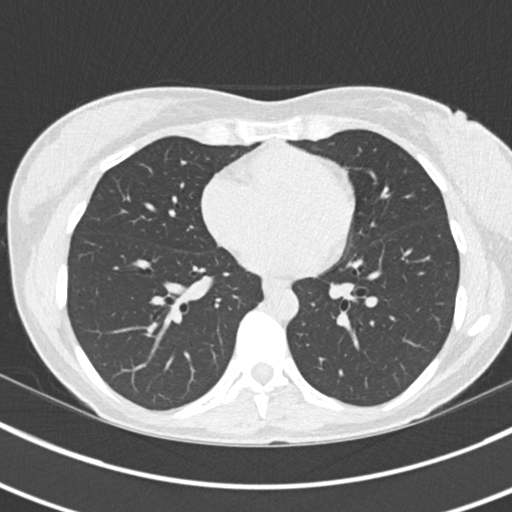
[im 89/164  lung]
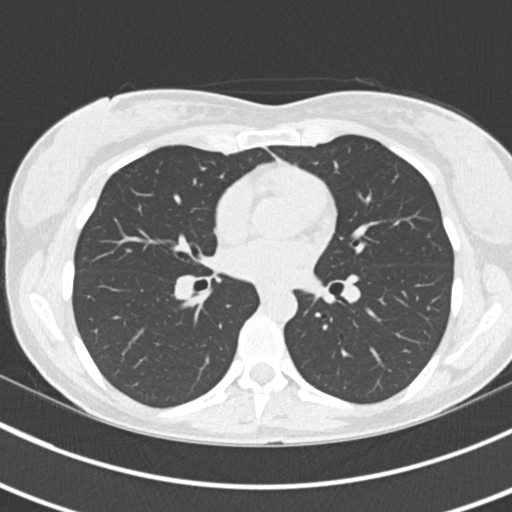
[im 104/164  lung]
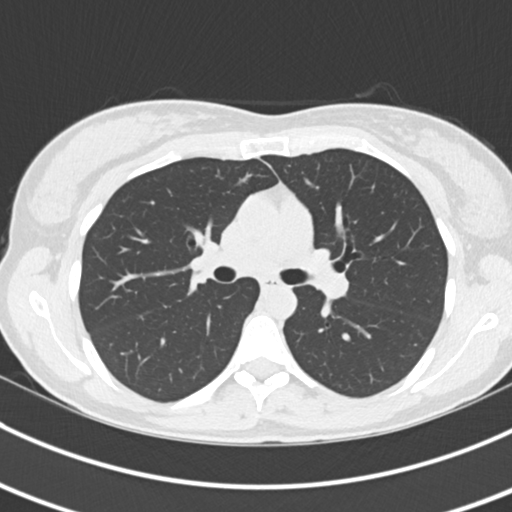
[im 112/164  mediastinal]
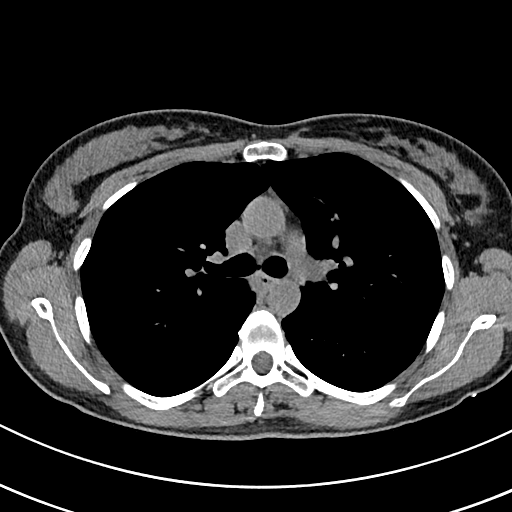
[im 112/164  lung]
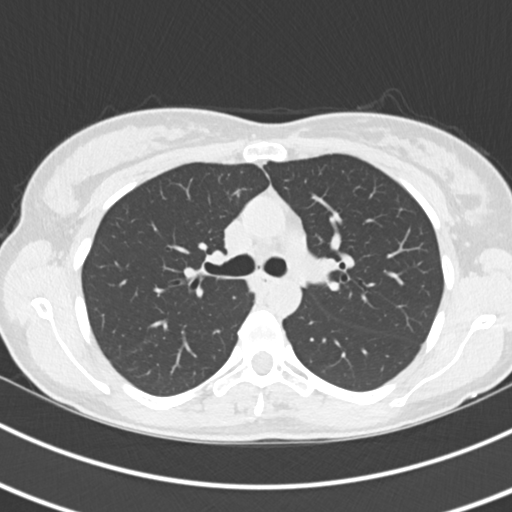
[im 126/164  lung]
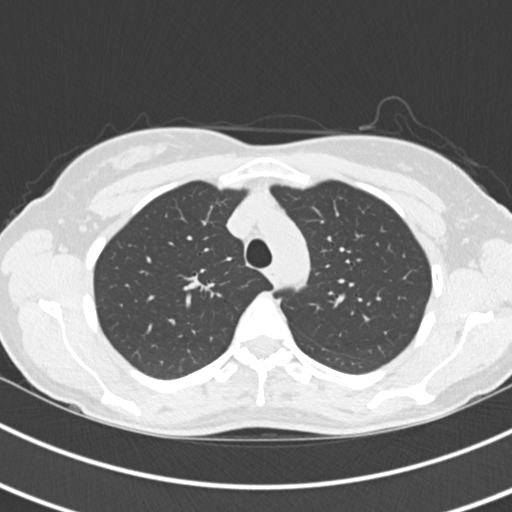
[im 141/164  lung]
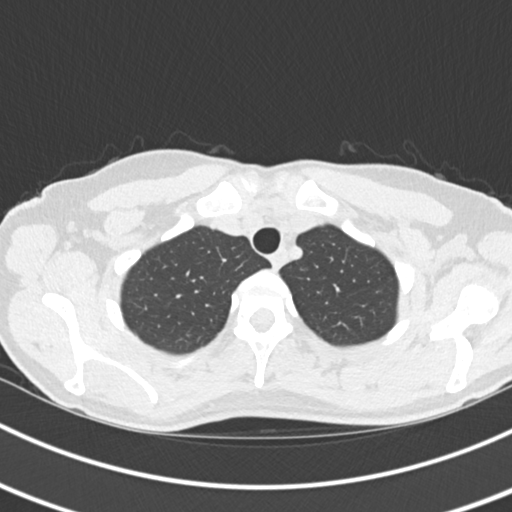
[im 156/164  lung]
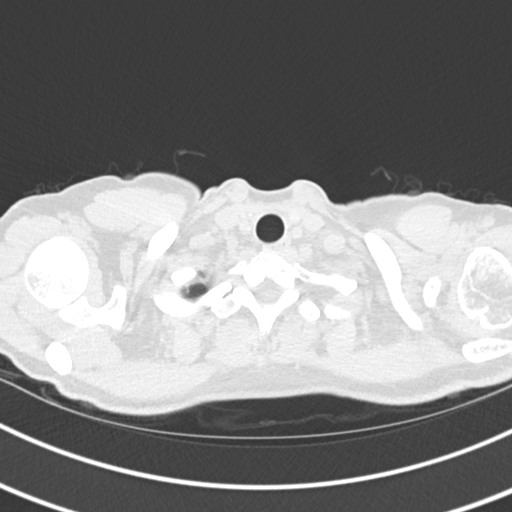

[Series 8: coronal · coronal · 0.65mm/px · 3 of 106 slices shown]
[im 22/106  lung]
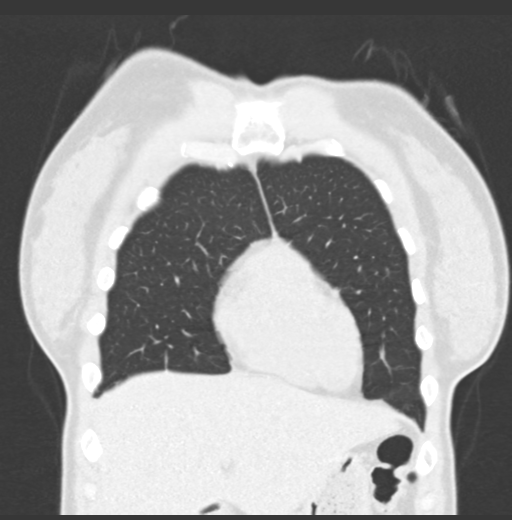
[im 43/106  lung]
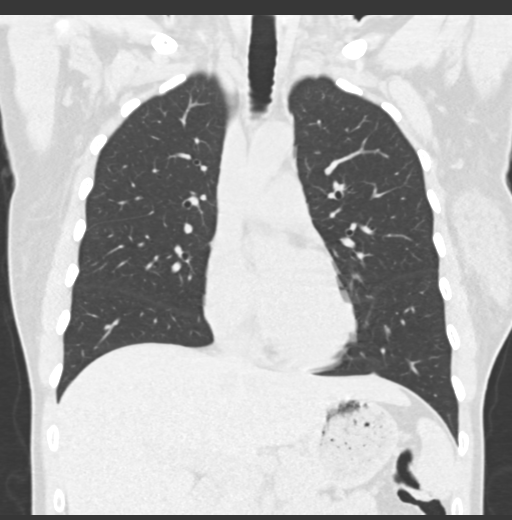
[im 64/106  lung]
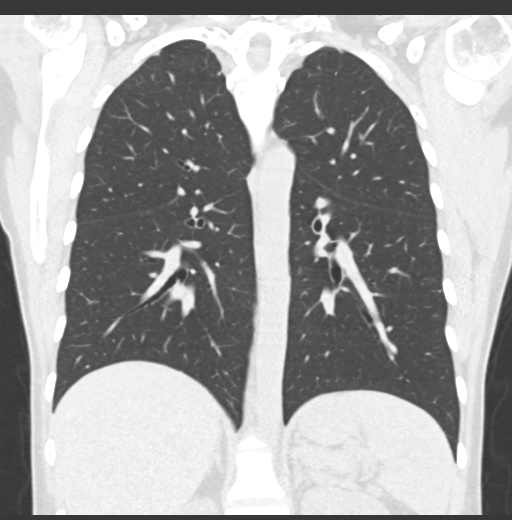

[15 of 36 positions shown; findings below may reference images not displayed]

FINDINGS: Cardiovascular: Vascular structures are unremarkable. Calcified
ductus arteriosus is incidentally noted. Heart size normal. No
pericardial effusion.

Mediastinum/Nodes: No pathologically enlarged mediastinal or
axillary lymph nodes. Hilar regions are difficult to evaluate
without IV contrast but appear grossly unremarkable. Esophagus is
grossly unremarkable.

Lungs/Pleura: Minimal biapical pleuroparenchymal scarring. No
subpleural reticulation, traction bronchiectasis/bronchiolectasis,
ground-glass, architectural distortion or honeycombing. Lungs are
otherwise clear. No pleural fluid. Airway is unremarkable. No air
trapping.

Upper Abdomen: Visualized portions of the liver, gallbladder,
adrenal glands, kidneys, spleen, pancreas, stomach and bowel are
grossly unremarkable.

Musculoskeletal: No worrisome lytic or sclerotic lesions.
IMPRESSION: No evidence of interstitial lung disease. No findings to explain the
patient's symptoms.

## 2019-05-22 ENCOUNTER — Other Ambulatory Visit: Payer: Self-pay | Admitting: Obstetrics and Gynecology

## 2019-05-22 DIAGNOSIS — Z1231 Encounter for screening mammogram for malignant neoplasm of breast: Secondary | ICD-10-CM

## 2019-07-03 ENCOUNTER — Ambulatory Visit
Admission: RE | Admit: 2019-07-03 | Discharge: 2019-07-03 | Disposition: A | Discharge status: Home / Self Care | Payer: 59 | Source: Ambulatory Visit | Attending: Obstetrics and Gynecology | Admitting: Obstetrics and Gynecology

## 2019-07-03 ENCOUNTER — Other Ambulatory Visit: Payer: Self-pay

## 2019-07-03 DIAGNOSIS — Z1231 Encounter for screening mammogram for malignant neoplasm of breast: Secondary | ICD-10-CM

## 2019-12-12 ENCOUNTER — Other Ambulatory Visit: Payer: 59

## 2019-12-13 ENCOUNTER — Other Ambulatory Visit: Payer: 59

## 2020-09-25 ENCOUNTER — Telehealth: Payer: Self-pay | Admitting: *Deleted

## 2020-09-25 NOTE — Telephone Encounter (Signed)
NOTES ON FILE FROM [facility], DR RUSSO. [phone]. REFERRAL SENT TO SCHEDULING.

## 2020-09-29 ENCOUNTER — Other Ambulatory Visit: Payer: Self-pay | Admitting: *Deleted

## 2020-09-29 DIAGNOSIS — R002 Palpitations: Secondary | ICD-10-CM

## 2020-10-05 ENCOUNTER — Telehealth: Payer: Self-pay | Admitting: Hematology and Oncology

## 2020-10-05 NOTE — Telephone Encounter (Signed)
Received a call from Ms. Cecena to reschedule her new hem appt to 11/18 at 9am to see Dr. Al Pimple.

## 2020-10-06 ENCOUNTER — Encounter (INDEPENDENT_AMBULATORY_CARE_PROVIDER_SITE_OTHER): Payer: 59

## 2020-10-06 DIAGNOSIS — R002 Palpitations: Secondary | ICD-10-CM

## 2020-10-07 NOTE — Progress Notes (Signed)
[facility] NOTE  Patient Care Team: [name], MD as PCP - General (Internal Medicine) Louretta Shorten, MD as Consulting Physician (Obstetrics and Gynecology)  CHIEF COMPLAINTS/PURPOSE OF CONSULTATION:  Low platelets  ASSESSMENT & PLAN:  No problem-specific Assessment & Plan notes found for this encounter.  Orders Placed This Encounter  Procedures  . CBC with Differential/Platelet    Standing Status:   Standing    Number of Occurrences:   22    Standing Expiration Date:   10/08/2021  . Technologist smear review    Standing Status:   Future    Number of Occurrences:   1    Standing Expiration Date:   10/08/2021  . Iron and TIBC    Standing Status:   Future    Number of Occurrences:   1    Standing Expiration Date:   10/08/2021  . Ferritin    Standing Status:   Future    Number of Occurrences:   1    Standing Expiration Date:   10/08/2021  . Vitamin B12    Standing Status:   Future    Number of Occurrences:   1    Standing Expiration Date:   10/08/2021  . Folate RBC    Standing Status:   Future    Number of Occurrences:   1    Standing Expiration Date:   10/08/2021  . Lactate dehydrogenase    Standing Status:   Future    Number of Occurrences:   1    Standing Expiration Date:   10/08/2021   Thrombocytopenia is defined as a platelet count below the lower limit of normal (ie, <150,000/microL [150 x 109/L] for adults).  Degrees of thrombocytopenia can be further subdivided into mild (platelet count 100,000 to 150,000/microL), moderate (50,000 to 99,000/microL), and severe (<50,000/microL) Most common causes of thrombocytopenia include but not limited to chronic liver disease or hypersplenism, immune thrombocytopenia, viral infections such as Hepatitis, HIV, active bacterial infections, autoimmune diseases, alcohol, nutritional deficiencies and medications. Rarely bone marrow disorders such as myelodysplatic syndrome, bone marrow failure syndromes, acute  leukemia and PNH can present with thrombocytopenia. Additional rare causes of thrombocytopenia include vascular conditions associated with platelet destruction (eg, giant capillary hemangioma, large aortic aneurysms, cardiopulmonary bypass, intraaortic balloon pumps.  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  Her mean platelet volume is also high which suggests ITP.  Review of systems completely unremarkable except for new onset palpitations and Covid positive thrombocytopenia.  Physical examination, very healthy-appearing female patient, no palpable lymphadenopathy or hepatosplenomegaly.  Have ordered some labs including a smear review, iron panel, S81, folic acid, LDH and reticulocyte count today.  I do not believe she needs any extensive investigation such as bone marrow biopsy at this time given mild and stable platelet count.  I do not believe she would be at any higher risk of bleeding then general population given her platelet count of 100,000.  I would encourage monitoring and return to clinic in 6 months.  She was very clearly instructed to notify of any unusual spontaneous bleeding or petechial rash.  She expressed understanding.  She will follow up with her PCP for the rest of her medical issues. She does not qualify for genetic testing based on her breast cancer history in the family at this time. Thank you for consulting [place] in the care of this patient.  Please do not hesitate to contact us with any additional questions or concerns.  HISTORY OF PRESENTING ILLNESS:  Alyssa Perez 44 y.o. female is here because of thrombocytopenia.  This is a very pleasant 44 yr old female patient with PMH significant for asthma and anxiety referred for further evaluation of mild thrombocytopenia.  Ms. Alyssa Perez is a very pleasant 44 year old female patient with no  significant past medical history, started experiencing some palpitations recently after her booster shot and went to see her primary care physician.  She had some work-up including a CBC which showed persistently low platelet count about 120,000 and hence referred to hematology for any further recommendations.  Ms. Alyssa Perez arrived to the appointment today by herself.  She is a good historian.  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  No B symptoms.  No change in breathing reported, bowel habits, urinary habits.  No bleeding other than menstrual bleeding.  No known autoimmune diseases although she was worked up for some in the past when she had chronic fatigue secondary to infectious mononucleosis.  She does believe that she has some autoimmune predisposition.  She denies any new medications or herbal supplements.  Rest of the pertinent 10 point ROS reviewed and negative except as mentioned  REVIEW OF SYSTEMS:   Constitutional: Denies fevers, chills or abnormal night sweats Eyes: Denies blurriness of vision, double vision or watery eyes Ears, nose, mouth, throat, and face: Denies mucositis or sore throat Respiratory: Denies cough, dyspnea or wheezes Cardiovascular: Denies chest discomfort or lower extremity swelling Gastrointestinal:  Denies nausea, heartburn or change in bowel habits Skin: Denies abnormal skin rashes Lymphatics: Denies new lymphadenopathy or easy bruising Neurological:Denies numbness, tingling or new weaknesses Behavioral/Psych: Mood is stable, no new changes  All other systems were reviewed with the patient and are negative.  MEDICAL HISTORY:  Past Medical History:  Diagnosis Date  . Asthma   . Heart murmur    as a child  . Shortness of breath    during URI    SURGICAL HISTORY: Past Surgical History:  Procedure Laterality Date  . CESAREAN SECTION      SOCIAL HISTORY: Social  History   Socioeconomic History  . Marital status: Married    Spouse name: Not on file  . Number of children: Not on file  . Years of education: Not on file  . Highest education level: Not on file  Occupational History  . Occupation: Stay at home   Tobacco Use  . Smoking status: Former Smoker    Packs/day: 0.10    Years: 4.00    Pack years: 0.40    Types: Cigarettes    Quit date: 08/08/1997    Years since quitting: 23.1  . Smokeless tobacco: Never Used  . Tobacco comment: Smoked in college socially   Vaping Use  . Vaping Use: Never used  Substance and Sexual Activity  . Alcohol use: Yes    Comment: occ  . Drug use: No  . Sexual activity: Yes    Birth control/protection: None  Other Topics Concern  . Not on file  Social History Narrative   Live with Husband, Alyssa Perez, and two children.    Social Determinants of Health   Financial Resource Strain:   . Difficulty of Paying Living Expenses: Not on file  Food Insecurity:   . Worried About Charity fundraiser in the Last Year: Not on  file  . [place] in the Last Year: Not on file  Transportation Needs:   . Lack of Transportation (Medical): Not on file  . Lack of Transportation (Non-Medical): Not on file  Physical Activity:   . Days of Exercise per Week: Not on file  . Minutes of Exercise per Session: Not on file  Stress:   . Feeling of Stress : Not on file  Social Connections:   . Frequency of Communication with Friends and Family: Not on file  . Frequency of Social Gatherings with Friends and Family: Not on file  . Attends Religious Services: Not on file  . Active Member of Clubs or Organizations: Not on file  . Attends Archivist Meetings: Not on file  . Marital Status: Not on file  Intimate Partner Violence:   . Fear of Current or Ex-Partner: Not on file  . Emotionally Abused: Not on file  . Physically Abused: Not on file  . Sexually Abused: Not on file    FAMILY HISTORY: Family History   Problem Relation Age of Onset  . Breast cancer Maternal Grandmother   . Bladder Cancer Father   . Lung cancer Paternal Grandfather   . Other Neg Hx     ALLERGIES:  is allergic to sulfa antibiotics, macrobid [nitrofurantoin monohyd macro], and penicillins.  MEDICATIONS:  Current Outpatient Medications  Medication Sig Dispense Refill  . Albuterol Sulfate (PROAIR RESPICLICK) 989 (90 Base) MCG/ACT AEPB Inhale 1 puff into the lungs every 4 (four) hours as needed (exercise).    . cetirizine (ZYRTEC) 10 MG tablet Take 10 mg by mouth daily.    Marland Kitchen EPIPEN 2-PAK 0.3 MG/0.3ML SOAJ injection See admin instructions.  1  . Fluticasone Furoate-Vilanterol (BREO ELLIPTA) 100-25 MCG/INH AEPB Inhale 1 puff into the lungs daily.    . Multiple Vitamin (MULTIVITAMIN WITH MINERALS) TABS tablet Take 1 tablet by mouth daily.    . pentosan polysulfate (ELMIRON) 100 MG capsule Take 200 mg by mouth 2 (two) times daily.     No current facility-administered medications for this visit.     PHYSICAL EXAMINATION: ECOG PERFORMANCE STATUS: 0 - Asymptomatic  Vitals:   10/08/20 0910  BP: 102/64  Resp: 20  Temp: (!) 97 F (36.1 C)  SpO2: 100%   Filed Weights   10/08/20 0910  Weight: 136 lb 3.2 oz (61.8 kg)    GENERAL:alert, no distress and comfortable SKIN: skin color, texture, turgor are normal, no rashes or significant lesions EYES: normal, conjunctiva are pink and non-injected, sclera clear OROPHARYNX:no exudate, no erythema and lips, buccal mucosa, and tongue normal  NECK: supple, thyroid normal size, non-tender, without nodularity LYMPH:  no palpable lymphadenopathy in the cervical, axillary or inguinal LUNGS: clear to auscultation and percussion with normal breathing effort HEART: regular rate & rhythm and no murmurs and no lower extremity edema. Has holter monitor to evaluate palpitations. ABDOMEN:abdomen soft, non-tender and normal bowel sounds Musculoskeletal:no cyanosis of digits and no clubbing   PSYCH: alert & oriented x 3 with fluent speech NEURO: no focal motor/sensory deficits  LABORATORY DATA:  I have reviewed the data as listed Lab Results  Component Value Date   WBC 15.1 (H) 09/26/2012   HGB 10.5 (L) 09/26/2012   HCT 31.8 (L) 09/26/2012   MCV 91.6 09/26/2012   PLT 156 09/26/2012     Chemistry   No results found for: NA, K, CL, CO2, BUN, CREATININE, GLU No results found for: CALCIUM, ALKPHOS, AST, ALT, BILITOT  I have reviewed labs from many yrs ago. Back in 2010, she had a platelet count of 121K and in 2013, she had a platelet count of 146 K. This was both during her pregnancies. Between 2013 and 2020, I had no labs to compare. Most recent labs from 11/2 120 K. WBC and Hb normal MPV is high,  RADIOGRAPHIC STUDIES: I have personally reviewed the radiological images as listed and agreed with the findings in the report. No results found.  All questions were answered. The patient knows to call the clinic with any problems, questions or concerns. I spent a total of 45 minutes in the care of this patient, including history and physical, review of her medical records and labs, counseling about causes of thrombocytopenia, treatment planning.     Benay Pike, MD 10/08/2020 9:52 AM

## 2020-10-08 ENCOUNTER — Inpatient Hospital Stay: Payer: 59

## 2020-10-08 ENCOUNTER — Inpatient Hospital Stay: Payer: 59 | Attending: Oncology | Admitting: Hematology and Oncology

## 2020-10-08 ENCOUNTER — Telehealth: Payer: Self-pay | Admitting: *Deleted

## 2020-10-08 ENCOUNTER — Other Ambulatory Visit: Payer: Self-pay

## 2020-10-08 VITALS — BP 102/64 | Temp 97.0°F | Resp 20 | Ht 68.0 in | Wt 136.2 lb

## 2020-10-08 DIAGNOSIS — Z87891 Personal history of nicotine dependence: Secondary | ICD-10-CM | POA: Insufficient documentation

## 2020-10-08 DIAGNOSIS — Z79899 Other long term (current) drug therapy: Secondary | ICD-10-CM | POA: Diagnosis not present

## 2020-10-08 DIAGNOSIS — Z803 Family history of malignant neoplasm of breast: Secondary | ICD-10-CM | POA: Insufficient documentation

## 2020-10-08 DIAGNOSIS — Z8052 Family history of malignant neoplasm of bladder: Secondary | ICD-10-CM | POA: Insufficient documentation

## 2020-10-08 DIAGNOSIS — D696 Thrombocytopenia, unspecified: Secondary | ICD-10-CM | POA: Diagnosis not present

## 2020-10-08 DIAGNOSIS — Z801 Family history of malignant neoplasm of trachea, bronchus and lung: Secondary | ICD-10-CM | POA: Diagnosis not present

## 2020-10-08 DIAGNOSIS — F419 Anxiety disorder, unspecified: Secondary | ICD-10-CM | POA: Insufficient documentation

## 2020-10-08 DIAGNOSIS — J45909 Unspecified asthma, uncomplicated: Secondary | ICD-10-CM | POA: Insufficient documentation

## 2020-10-08 LAB — CBC WITH DIFFERENTIAL/PLATELET
Abs Immature Granulocytes: 0.01 10*3/uL (ref 0.00–0.07)
Basophils Absolute: 0 10*3/uL (ref 0.0–0.1)
Basophils Relative: 1 %
Eosinophils Absolute: 0.1 10*3/uL (ref 0.0–0.5)
Eosinophils Relative: 1 %
HCT: 36.1 % (ref 36.0–46.0)
Hemoglobin: 12.2 g/dL (ref 12.0–15.0)
Immature Granulocytes: 0 %
Lymphocytes Relative: 27 %
Lymphs Abs: 1.8 10*3/uL (ref 0.7–4.0)
MCH: 30.3 pg (ref 26.0–34.0)
MCHC: 33.8 g/dL (ref 30.0–36.0)
MCV: 89.6 fL (ref 80.0–100.0)
Monocytes Absolute: 0.5 10*3/uL (ref 0.1–1.0)
Monocytes Relative: 7 %
Neutro Abs: 4.4 10*3/uL (ref 1.7–7.7)
Neutrophils Relative %: 64 %
Platelets: 174 10*3/uL (ref 150–400)
RBC: 4.03 MIL/uL (ref 3.87–5.11)
RDW: 12.5 % (ref 11.5–15.5)
WBC: 6.8 10*3/uL (ref 4.0–10.5)
nRBC: 0 % (ref 0.0–0.2)

## 2020-10-08 LAB — TECHNOLOGIST SMEAR REVIEW

## 2020-10-08 LAB — LACTATE DEHYDROGENASE: LDH: 166 U/L (ref 98–192)

## 2020-10-08 LAB — IRON AND TIBC
Iron: 105 ug/dL (ref 41–142)
Saturation Ratios: 27 % (ref 21–57)
TIBC: 389 ug/dL (ref 236–444)
UIBC: 284 ug/dL (ref 120–384)

## 2020-10-08 LAB — VITAMIN B12: Vitamin B-12: 321 pg/mL (ref 180–914)

## 2020-10-08 LAB — FERRITIN: Ferritin: 41 ng/mL (ref 11–307)

## 2020-10-08 NOTE — Telephone Encounter (Signed)
-----   Message from Rachel Moulds, MD sent at 10/08/2020  1:40 PM EST ----- Selena Batten, Please let the patient know that labs from yesterday are without any findings, platelet count is normal.Thanks,

## 2020-10-08 NOTE — Telephone Encounter (Signed)
Lab results were communicated to patient.  No further questions.

## 2020-10-09 ENCOUNTER — Ambulatory Visit: Payer: 59 | Admitting: Cardiology

## 2020-10-09 ENCOUNTER — Encounter: Payer: Self-pay | Admitting: Cardiology

## 2020-10-09 VITALS — BP 114/74 | HR 78 | Ht 69.0 in | Wt 136.0 lb

## 2020-10-09 DIAGNOSIS — R002 Palpitations: Secondary | ICD-10-CM

## 2020-10-09 DIAGNOSIS — R0602 Shortness of breath: Secondary | ICD-10-CM

## 2020-10-09 DIAGNOSIS — Z87891 Personal history of nicotine dependence: Secondary | ICD-10-CM

## 2020-10-09 LAB — FOLATE RBC
Folate, Hemolysate: 411 ng/mL
Folate, RBC: 1093 ng/mL (ref >498)
Hematocrit: 37.6 % (ref 34.0–46.6)

## 2020-10-09 NOTE — Progress Notes (Deleted)
    Patient referred by [name], MD for ***  Subjective:   Alyssa Perez, female    DOB: Jan 02, 1976, 44 y.o.   MRN: [id number]  *** No chief complaint on file.   *** HPI  44 y.o. *** female with ***  *** Past Medical History:  Diagnosis Date  . Asthma   . Heart murmur    as a child  . Shortness of breath    during URI    *** Past Surgical History:  Procedure Laterality Date  . CESAREAN SECTION      *** Social History   Tobacco Use  Smoking Status Former Smoker  . Packs/day: 0.10  . Years: 4.00  . Pack years: 0.40  . Types: Cigarettes  . Quit date: 08/08/1997  . Years since quitting: 23.1  Smokeless Tobacco Never Used  Tobacco Comment   Smoked in college socially     Social History   Substance and Sexual Activity  Alcohol Use Yes   Comment: occ    *** Family History  Problem Relation Age of Onset  . Breast cancer Maternal Grandmother   . Bladder Cancer Father   . Lung cancer Paternal Grandfather   . Other Neg Hx     *** Current Outpatient Medications on File Prior to Visit  Medication Sig Dispense Refill  . Albuterol Sulfate (PROAIR RESPICLICK) 267 (90 Base) MCG/ACT AEPB Inhale 1 puff into the lungs every 4 (four) hours as needed (exercise).    Marland Kitchen EPIPEN 2-PAK 0.3 MG/0.3ML SOAJ injection See admin instructions.  1  . Fluticasone Furoate-Vilanterol (BREO ELLIPTA) 100-25 MCG/INH AEPB Inhale 1 puff into the lungs daily.    . pentosan polysulfate (ELMIRON) 100 MG capsule Take 200 mg by mouth 2 (two) times daily.     No current facility-administered medications on file prior to visit.    Cardiovascular and other pertinent studies:  *** EKG ***/***/202***: ***  *** Recent labs: 08/10/2020-09/22/2020: Glucose 94, BUN/Cr 11/0.7. EGFR 91.3. Na/K 142/4.0. Rest of the CMP normal H/H 13.1/39.3. MCV 91.4. Platelets 126 ***HbA1C ***% Chol 177, TG 60, HDL 68, LDL 97 ***TSH 0.94 normal   *** ROS      *** There were no vitals filed for  this visit.   There is no height or weight on file to calculate BMI. There were no vitals filed for this visit.  *** Objective:   Physical Exam    ***     Assessment & Recommendations:   ***  ***  Thank you for referring the patient to [place]. Please feel free to contact with any questions.   Nigel Mormon, MD Pager: [phone] Office: [phone] .

## 2020-10-09 NOTE — Progress Notes (Signed)
Date:  10/09/2020   ID:  Alyssa Perez, DOB May 01, 1976, MRN [id number]  PCP:  [name], MD  Cardiologist:  Rex Kras, DO, [facility] (established care 10/09/2020)  REASON FOR CONSULT: Shortness of breath and palpitations  REQUESTING PHYSICIAN:  [name], MD [address] [place],  [place] [address]  Chief Complaint  Patient presents with  . Palpitations  . New Patient (Initial Visit)    HPI  Alyssa Perez is a 44 y.o. female who presents to the office with a chief complaint of " palpitations." Patient's past medical history  include: History of interstitial cystitis, thrombocytopenia.  She is referred to the office at the request of [name], MD for evaluation of palpitations.  Patient states that her palpitations started on 09/17/2020 approximately 2 days after getting her Moderna booster.  This is her first episode of palpitations.  Patient states that the symptoms are usually brought on while she is resting and relaxing.  They last for about 45 minutes or so in duration but during that time they are intermittent.  She describes a sensation as " triple heartbeats."  She notices them more before going to bed.  When she falls asleep she never wakes up with because of symptoms of palpitations.  During these episodes at times she has experienced shortness of breath as well.  No significant change in her other chronic comorbid conditions.  No new medications.  She is always consumed 2 cups of coffee in the morning and no other significant caffeine intake.  No herbal supplements, no energy drinks, and no known thyroid disease.  Patient states that she was mailed a monitor at home started wearing it 10/07/2020.  Patient states that she does not know the duration of how long the monitor is suppose to be worn.   Despite wearing the monitor she has palpitation and was referred here for further evaluaiton.   No prior history of exertional syncope.    No family history of premature  coronary disease or sudden cardiac death.  ALLERGIES: Allergies  Allergen Reactions  . Sulfa Antibiotics Anaphylaxis and Other (See Comments)    Patient does not recall reaction, but states that "it put her in hospital." could not walk became very weak   . Macrobid [Nitrofurantoin [employer]  . Penicillins Rash    Childhood reaction, pt is able to take ceftin Has patient had a PCN reaction causing immediate rash, facial/tongue/throat swelling, SOB or lightheadedness with hypotension: unknown Has patient had a PCN reaction causing severe rash involving mucus membranes or skin necrosis: unknown Has patient had a PCN reaction that required hospitalization unknown Has patient had a PCN reaction occurring within the last 10 years: no If all of the above answers are "NO", then may proceed with Cephalosporin use.     MEDICATION LIST PRIOR TO VISIT: Current Meds  Medication Sig  . Albuterol Sulfate (PROAIR RESPICLICK) 629 (90 Base) MCG/ACT AEPB Inhale 1 puff into the lungs every 4 (four) hours as needed (exercise).  . Cetirizine HCl (ZYRTEC ALLERGY) 10 MG CAPS Take 10 mg by mouth daily.  Marland Kitchen EPINEPHrine 0.3 mg/0.3 mL IJ SOAJ injection See admin instructions.  . Fluticasone Furoate-Vilanterol (BREO ELLIPTA) 100-25 MCG/INH AEPB Inhale 1 puff into the lungs daily.  Marland Kitchen ibuprofen (ADVIL) 800 MG tablet Take 800 mg by mouth as needed.  . pentosan polysulfate (ELMIRON) 100 MG capsule Take 200 mg by mouth 2 (two) times daily.  . [DISCONTINUED] Biotin 1 MG CAPS Take 1 capsule  by mouth daily.     PAST MEDICAL HISTORY: Past Medical History:  Diagnosis Date  . Asthma   . Heart murmur    as a child  . Shortness of breath    during URI    PAST SURGICAL HISTORY: Past Surgical History:  Procedure Laterality Date  . CESAREAN SECTION      FAMILY HISTORY: The patient family history includes Bladder Cancer in her father; Breast cancer in her maternal grandmother; Hypertension in her father;  Lung cancer in her paternal grandfather.  SOCIAL HISTORY:  The patient  reports that she quit smoking about 23 years ago. Her smoking use included cigarettes. She has a 0.40 pack-year smoking history. She has never used smokeless tobacco. She reports current alcohol use. She reports that she does not use drugs.  REVIEW OF SYSTEMS: Review of Systems  Constitutional: Negative for chills and fever.  HENT: Negative for hoarse voice and nosebleeds.   Eyes: Negative for discharge, double vision and pain.  Cardiovascular: Positive for palpitations. Negative for chest pain, claudication, dyspnea on exertion, leg swelling, near-syncope, orthopnea, paroxysmal nocturnal dyspnea and syncope.  Respiratory: Positive for shortness of breath. Negative for hemoptysis.   Musculoskeletal: Negative for muscle cramps and myalgias.  Gastrointestinal: Negative for abdominal pain, constipation, diarrhea, hematemesis, hematochezia, melena, nausea and vomiting.  Neurological: Negative for dizziness and light-headedness.    PHYSICAL EXAM: Vitals with BMI 10/09/2020 10/08/2020 08/08/2017  Height 5\' 9"  5\' 8"  5\' 8"   Weight 136 lbs 136 lbs 3 oz 146 lbs 13 oz  BMI 20.07 20.71 22.33  Systolic 114 102  Diastolic 74 64 78  Pulse 78 - 65   CONSTITUTIONAL: Well-developed and well-nourished. No acute distress.  SKIN: Skin is warm and dry. No rash noted. No cyanosis. No pallor. No jaundice HEAD: Normocephalic and atraumatic.  EYES: No scleral icterus MOUTH/THROAT: Moist oral membranes.  NECK: No JVD present. No thyromegaly noted. No carotid bruits  LYMPHATIC: No visible cervical adenopathy.  CHEST Normal respiratory effort. No intercostal retractions  LUNGS: Clear to auscultation bilaterally.  No stridor. No wheezes. No rales.  CARDIOVASCULAR: Regular rate and rhythm, positive S1-S2, no murmurs rubs or gallops appreciated. ABDOMINAL: Nonobese, soft, nontender, nondistended, positive bowel sounds in all 4 quadrants,  no apparent ascites.  EXTREMITIES: No peripheral edema, 2+ dorsalis pedis and posterior tibial pulses. HEMATOLOGIC: No significant bruising NEUROLOGIC: Oriented to person, place, and time. Nonfocal. Normal muscle tone.  PSYCHIATRIC: Normal mood and affect. Normal behavior. Cooperative  CARDIAC DATABASE: EKG: 10/09/2020: Normal sinus rhythm, 76 bpm, normal axis, without underlying ischemia or injury pattern  Echocardiogram: No results found for this or any previous visit from the past 1095 days.   Stress Testing: No results found for this or any previous visit from the past 1095 days.  Heart Catheterization: None  LABORATORY DATA: CBC Latest Ref Rng & Units 10/08/2020 10/08/2020 09/26/2012  WBC 4.0 - 10.5 K/uL 6.8 - 15.1(H)  Hemoglobin 12.0 - 15.0 g/dL 10/10/2020 - 10.5(L)  Hematocrit 34.0 - 46.6 % 36.1 37.6 31.8(L)  Platelets 150 - 400 K/uL 174 - 156   External Labs: Collected: 09/22/2020 Creatinine 0.7 mg/dL. Hemoglobin 13.1 g/dL, hematocrit 13/04/2012, platelets 126K eGFR: 91 mL/min per 1.73 m TSH: 0.94  Collected: 08/10/2020 Lipid profile: Total cholesterol 177, triglycerides 60, HDL 68, LDL 97.  IMPRESSION:    ICD-10-CM   1. Palpitations  R00.2 EKG 12-Lead    PCV ECHOCARDIOGRAM COMPLETE  2. Shortness of breath  R06.02 PCV CARDIAC STRESS TEST  3.  Former smoker  Z87.891      RECOMMENDATIONS: Alyssa Perez is a 44 y.o. female whose past medical history include: Interstitial cystitis and thrombocytopenia.  Palpitations:  No known reversible causes.  Labs from her PCPs office were independently reviewed and findings summarized above.  Patient already has a monitor she was told to monitor it for 1 week in the instruction that she received in the mail note that she needs to wear it for a month.  Have asked her to call her PCP to clarify the order.  Recommended that she brings in a copy of the monitor prior to the next office visit.  No episodes of syncope.  EKG shows  normal sinus rhythm without underlying dysrhythmia or ectopic beats.  Echocardiogram will be ordered to evaluate for structural heart disease and left ventricular systolic function.  Plan exercise treadmill stress test.  Patient will need to have a Covid screen prior to [facility].  Patient is advised to seek medical attention by going to the closest ER via EMS if she has new onset of syncope associated with palpitations.  Patient has had isolated episodes of shortness of breath at times with palpitations.  Continue to monitor symptoms for now.  She is overall euvolemic on examination has no prior history of CHF and low probability for PE/DVT.  Former smoker: Educated on the importance of continued smoking cessation.  FINAL MEDICATION LIST END OF ENCOUNTER: No orders of the defined types were placed in this encounter.   Medications Discontinued During This Encounter  Medication Reason  . EPIPEN 2-PAK 0.3 MG/0.3ML SOAJ injection Duplicate  . Biotin 1 MG CAPS Completed Course     Current Outpatient Medications:  .  Albuterol Sulfate (PROAIR RESPICLICK) 032 (90 Base) MCG/ACT AEPB, Inhale 1 puff into the lungs every 4 (four) hours as needed (exercise)., Disp: , Rfl:  .  Cetirizine HCl (ZYRTEC ALLERGY) 10 MG CAPS, Take 10 mg by mouth daily., Disp: , Rfl:  .  EPINEPHrine 0.3 mg/0.3 mL IJ SOAJ injection, See admin instructions., Disp: , Rfl:  .  Fluticasone Furoate-Vilanterol (BREO ELLIPTA) 100-25 MCG/INH AEPB, Inhale 1 puff into the lungs daily., Disp: , Rfl:  .  ibuprofen (ADVIL) 800 MG tablet, Take 800 mg by mouth as needed., Disp: , Rfl:  .  pentosan polysulfate (ELMIRON) 100 MG capsule, Take 200 mg by mouth 2 (two) times daily., Disp: , Rfl:   Orders Placed This Encounter  Procedures  . PCV CARDIAC STRESS TEST  . EKG 12-Lead  . PCV ECHOCARDIOGRAM COMPLETE   There are no Patient Instructions on file for this visit.   --Continue cardiac medications as reconciled in final medication  list. --Return in about 6 weeks (around 11/20/2020) for Reevaluation of palpitations, Review test results. Or sooner if needed. --Continue follow-up with your primary care physician regarding the management of your other chronic comorbid conditions.  Patient's questions and concerns were addressed to her satisfaction. She voices understanding of the instructions provided during this encounter.   This note was created using a voice recognition software as a result there may be grammatical errors inadvertently enclosed that do not reflect the nature of this encounter. Every attempt is made to correct such errors.  Rex Kras, [place], [facility]  Pager: [phone] Office: [phone]

## 2020-10-12 ENCOUNTER — Ambulatory Visit: Payer: 59

## 2020-10-12 ENCOUNTER — Other Ambulatory Visit: Payer: Self-pay

## 2020-10-12 DIAGNOSIS — R002 Palpitations: Secondary | ICD-10-CM

## 2020-10-14 ENCOUNTER — Encounter: Payer: 59 | Admitting: Oncology

## 2020-10-26 NOTE — Progress Notes (Signed)
Spoke to patient regarding her echo results pt is aware of results and next ov

## 2020-11-04 ENCOUNTER — Ambulatory Visit: Payer: 59

## 2020-11-04 ENCOUNTER — Other Ambulatory Visit: Payer: Self-pay

## 2020-11-04 DIAGNOSIS — R0602 Shortness of breath: Secondary | ICD-10-CM

## 2020-11-05 ENCOUNTER — Telehealth: Payer: Self-pay | Admitting: Cardiology

## 2020-11-05 NOTE — Telephone Encounter (Signed)
I checked with our medical records department.  We would need a release signed to fax results to Dr. Odis Hollingshead. Your test results have been imported and assigned to a cardiologist to be reviewed back on 10/28/2020.  Test has not been reviewed by the assigned cardiologist to date, so I re-assigned test to today's DOD, to expedite results being sent ordering physician, Dr. Timothy Lasso.   If Dr. Odis Hollingshead has access to Epic he will be able to view results there, otherwise, you can sign a release at our office or Dr. Ferd Hibbs office to have results faxed to Dr.Tolia.

## 2020-11-05 NOTE — Telephone Encounter (Signed)
Patient called and wanted to make sure the results from the monitor Dr. Timothy Lasso ordered will be going to her Cardiologist Dr. Tessa Lerner at [facility].  Please fax results to [phone]

## 2020-11-25 ENCOUNTER — Encounter: Payer: Self-pay | Admitting: Cardiology

## 2020-11-25 ENCOUNTER — Ambulatory Visit: Payer: 59 | Admitting: Cardiology

## 2020-11-25 ENCOUNTER — Other Ambulatory Visit: Payer: Self-pay

## 2020-11-25 VITALS — BP 107/62 | HR 73 | Ht 69.0 in | Wt 143.0 lb

## 2020-11-25 DIAGNOSIS — Z87891 Personal history of nicotine dependence: Secondary | ICD-10-CM

## 2020-11-25 DIAGNOSIS — R002 Palpitations: Secondary | ICD-10-CM

## 2020-11-25 NOTE — Progress Notes (Signed)
Date:  11/25/2020   ID:  Alyssa Perez, DOB Jun 10, 1976, MRN [id number]  PCP:  [name], MD  Cardiologist:  Rex Kras, DO, [facility] (established care 10/09/2020)  Date: 11/25/20 Last Office Visit: 10/09/2020  Chief Complaint  Patient presents with  . Palpitations  . Results    HPI  Alyssa Perez is a 45 y.o. female who presents to the office with a chief complaint of "reevaluation of palpitations and review test results." Patient's past medical history  include: History of interstitial cystitis, thrombocytopenia.  She is referred to the office at the request of [name], MD for evaluation of palpitations.  Patient was referred to the office for evaluation of palpitations.  At the time of the original consultation she already had a monitor placed at the request of her PCP and the results were pending.  Since then patient has also undergone an echocardiogram and exercise treadmill stress test.  Echocardiogram notes preserved LVEF without any significant regional wall motion abnormalities.  Stress test notes good chronotropic competence and overall low risk study.  With regards to palpitations patient states that the symptoms had improved over the holiday season however for the last 3 days they have resurfaced.  She thinks is most likely secondary to stress, decreased sleep, and feeling generalized tired.  She does not have any associated symptoms of near syncope or syncope.  The monitor that she had done at the outside facility was independently reviewed at today's encounter.  Underlying rhythm is sinus without any significant dysrhythmias.  Ventricular ectopic burden approximately 1% and supraventricular ectopic burden less than 1%.  She had several patient activated events which noted the underlying rhythm to be normal sinus with PVCs.  Shortness of breath with effort related activities she was complaining of at the last visit has resolved.  No family history of premature coronary  disease or sudden cardiac death.  ALLERGIES: Allergies  Allergen Reactions  . Sulfa Antibiotics Anaphylaxis and Other (See Comments)    Patient does not recall reaction, but states that "it put her in hospital." could not walk became very weak   . Macrobid [Nitrofurantoin [employer]  . Other Other (See Comments)  . Penicillins Rash    Childhood reaction, pt is able to take ceftin Has patient had a PCN reaction causing immediate rash, facial/tongue/throat swelling, SOB or lightheadedness with hypotension: unknown Has patient had a PCN reaction causing severe rash involving mucus membranes or skin necrosis: unknown Has patient had a PCN reaction that required hospitalization unknown Has patient had a PCN reaction occurring within the last 10 years: no If all of the above answers are "NO", then may proceed with Cephalosporin use.     MEDICATION LIST PRIOR TO VISIT: Current Meds  Medication Sig  . Cetirizine HCl (ZYRTEC ALLERGY) 10 MG CAPS Take 10 mg by mouth daily.  Marland Kitchen EPINEPHrine 0.3 mg/0.3 mL IJ SOAJ injection See admin instructions.  . fluticasone furoate-vilanterol (BREO ELLIPTA) 100-25 MCG/INH AEPB Inhale 1 puff into the lungs daily.  Marland Kitchen ibuprofen (ADVIL) 800 MG tablet Take 800 mg by mouth as needed.  . pentosan polysulfate (ELMIRON) 100 MG capsule Take 200 mg by mouth 2 (two) times daily.     PAST MEDICAL HISTORY: Past Medical History:  Diagnosis Date  . Asthma   . Heart murmur    as a child  . Shortness of breath    during URI    PAST SURGICAL HISTORY: Past Surgical History:  Procedure Laterality Date  .  CESAREAN SECTION      FAMILY HISTORY: The patient family history includes Bladder Cancer in her father; Breast cancer in her maternal grandmother; Hypertension in her father; Lung cancer in her paternal grandfather.  SOCIAL HISTORY:  The patient  reports that she quit smoking about 23 years ago. Her smoking use included cigarettes. She has a 0.40 pack-year  smoking history. She has never used smokeless tobacco. She reports current alcohol use. She reports that she does not use drugs.  REVIEW OF SYSTEMS: Review of Systems  Constitutional: Negative for chills and fever.  HENT: Negative for hoarse voice and nosebleeds.   Eyes: Negative for discharge, double vision and pain.  Cardiovascular: Positive for palpitations. Negative for chest pain, claudication, dyspnea on exertion, leg swelling, near-syncope, orthopnea, paroxysmal nocturnal dyspnea and syncope.  Respiratory: Negative for hemoptysis and shortness of breath.   Musculoskeletal: Negative for muscle cramps and myalgias.  Gastrointestinal: Negative for abdominal pain, constipation, diarrhea, hematemesis, hematochezia, melena, nausea and vomiting.  Neurological: Negative for dizziness and light-headedness.    PHYSICAL EXAM: Vitals with BMI 11/25/2020 10/09/2020 10/08/2020  Height 5' 9"  5' 9"  5' 8"   Weight 143 lbs 136 lbs 136 lbs 3 oz  BMI 21.11 09.98 33.82  Systolic 505 397 673  Diastolic 62 74 64  Pulse 73 78 -   CONSTITUTIONAL: Well-developed and well-nourished. No acute distress.  SKIN: Skin is warm and dry. No rash noted. No cyanosis. No pallor. No jaundice HEAD: Normocephalic and atraumatic.  EYES: No scleral icterus MOUTH/THROAT: Moist oral membranes.  NECK: No JVD present. No thyromegaly noted. No carotid bruits  LYMPHATIC: No visible cervical adenopathy.  CHEST Normal respiratory effort. No intercostal retractions  LUNGS: Clear to auscultation bilaterally.  No stridor. No wheezes. No rales.  CARDIOVASCULAR: Regular rate and rhythm, positive S1-S2, no murmurs rubs or gallops appreciated. ABDOMINAL: Nonobese, soft, nontender, nondistended, positive bowel sounds in all 4 quadrants, no apparent ascites.  EXTREMITIES: No peripheral edema, 2+ dorsalis pedis and posterior tibial pulses. HEMATOLOGIC: No significant bruising NEUROLOGIC: Oriented to person, place, and time. Nonfocal.  Normal muscle tone.  PSYCHIATRIC: Normal mood and affect. Normal behavior. Cooperative  CARDIAC DATABASE: EKG: 11/25/2020: Sinus  Rhythm, 67bpm, without underlying injury or ischemia.  Echocardiogram: Echocardiogram 10/12/2020: Left ventricle cavity is normal in size and wall thickness. Normal global wall motion. Normal LV systolic function with EF 58%.  Normal diastolic filling pattern. Mild (Grade I) mitral regurgitation. No evidence of pulmonary hypertension.   Stress Testing: Exercise treadmill stress test 11/04/2020: Patient exercised for 12 minutes and 2 seconds on Bruce protocol; achieved 13.48 METs at 94% of maximum predicted heart rate. Chest pain is not present. The heart rate and BP response are physiological. Stress ECG negative for ischemia. Low risk study  Heart Catheterization: None  LABORATORY DATA: CBC Latest Ref Rng & Units 10/08/2020 10/08/2020 09/26/2012  WBC 4.0 - 10.5 K/uL 6.8 - 15.1(H)  Hemoglobin 12.0 - 15.0 g/dL 12.2 - 10.5(L)  Hematocrit 34.0 - 46.6 % 36.1 37.6 31.8(L)  Platelets 150 - 400 K/uL 174 - 156   External Labs: Collected: 09/22/2020 Creatinine 0.7 mg/dL. Hemoglobin 13.1 g/dL, hematocrit 39.3%, platelets 126K eGFR: 91 mL/min per 1.73 m TSH: 0.94  Collected: 08/10/2020 Lipid profile: Total cholesterol 177, triglycerides 60, HDL 68, LDL 97.  IMPRESSION:    ICD-10-CM   1. Palpitations  R00.2 EKG 12-Lead     RECOMMENDATIONS: Alyssa Perez is a 45 y.o. female whose past medical history include: Interstitial cystitis and thrombocytopenia.  Palpitations:  Improving.  No known reversible causes.  Labs from her PCPs office were independently reviewed and findings summarized above.  No episodes of syncope.  EKG shows normal sinus rhythm without underlying dysrhythmia or ectopic beats.  Echo notes preserved LVEF, without regional wall motion abnormalities, or any significant valvular heart disease.  GXT overall a low risk study with  chronotropic competence preserved.  Outside monitor results reviewed independently with the patient at today's office visit.  Overall normal sinus rhythm without any significant dysrhythmias.  Patient activated events noted the underlying rhythm to be sinus with occasional PVCs.  Since last office visit patient states that for majority of the time her symptoms have resolved up until 3 days ago they resurfaced.  The shared decision was to continue working on factors that aggravate her symptoms and suggests increased stress, decreased sleep.  Patient continues to have symptoms may consider a low-dose beta-blocker therapy.  Former smoker: Educated on the importance of continued smoking cessation.  Since last visit her symptoms of shortness of breath have resolved.  No additional work-up warranted at this time.  I will see her on a yearly basis or sooner if needed.  FINAL MEDICATION LIST END OF ENCOUNTER: No orders of the defined types were placed in this encounter.   There are no discontinued medications.   Current Outpatient Medications:  .  Cetirizine HCl (ZYRTEC ALLERGY) 10 MG CAPS, Take 10 mg by mouth daily., Disp: , Rfl:  .  EPINEPHrine 0.3 mg/0.3 mL IJ SOAJ injection, See admin instructions., Disp: , Rfl:  .  fluticasone furoate-vilanterol (BREO ELLIPTA) 100-25 MCG/INH AEPB, Inhale 1 puff into the lungs daily., Disp: , Rfl:  .  ibuprofen (ADVIL) 800 MG tablet, Take 800 mg by mouth as needed., Disp: , Rfl:  .  pentosan polysulfate (ELMIRON) 100 MG capsule, Take 200 mg by mouth 2 (two) times daily., Disp: , Rfl:  .  Albuterol Sulfate (PROAIR RESPICLICK) 618 (90 Base) MCG/ACT AEPB, Inhale 1 puff into the lungs every 4 (four) hours as needed (exercise). (Patient not taking: Reported on 11/25/2020), Disp: , Rfl:   Orders Placed This Encounter  Procedures  . EKG 12-Lead   There are no Patient Instructions on file for this visit.   --Continue cardiac medications as reconciled in final  medication list. --Return in about 1 year (around 11/25/2021), or if symptoms worsen or fail to improve, for Follow up palpitations. . Or sooner if needed. --Continue follow-up with your primary care physician regarding the management of your other chronic comorbid conditions.  Patient's questions and concerns were addressed to her satisfaction. She voices understanding of the instructions provided during this encounter.   This note was created using a voice recognition software as a result there may be grammatical errors inadvertently enclosed that do not reflect the nature of this encounter. Every attempt is made to correct such errors.  Total time spent: 32 minutes reevaluating his symptomatology, going over diagnostic testing, and independently reviewing outside monitor results with the patient at today's office visit.  Discussed disease management and coordination of care.  Rex Kras, [place], [facility]  Pager: [phone] Office: [phone]

## 2021-04-05 ENCOUNTER — Telehealth: Payer: Self-pay | Admitting: Hematology and Oncology

## 2021-04-05 NOTE — Telephone Encounter (Signed)
Cancelled appt per 5/16 sch msg. Pt declined to r/s at this time.

## 2021-04-07 ENCOUNTER — Ambulatory Visit: Payer: 59 | Admitting: Hematology and Oncology

## 2021-10-18 ENCOUNTER — Telehealth: Payer: Self-pay | Admitting: Cardiology

## 2021-10-18 NOTE — Telephone Encounter (Signed)
See above

## 2021-10-18 NOTE — Telephone Encounter (Signed)
PT started her NP EVAL with [place] in November of 2021 for some palps she believes were caused by the mederna covid vaccine. With the rise in covid cases and her future travel plans she was thinking it was time she needs to get a booster. She wants to know if we advise against it or if we think it is a good idea for her to get it. If so she asked if she should stick with moderna or get the [employer] booster.

## 2021-10-19 NOTE — Telephone Encounter (Signed)
She can get her vaccine.  I will refer her back to her PCP to carry out the discussion of moderna vs. Pfizer.   Thanks.  ST

## 2021-10-20 NOTE — Telephone Encounter (Signed)
Lvm for pt to call back. 

## 2021-10-21 ENCOUNTER — Encounter: Payer: Self-pay | Admitting: Cardiology

## 2022-09-26 ENCOUNTER — Encounter: Payer: Self-pay | Admitting: Gastroenterology

## 2022-10-18 ENCOUNTER — Other Ambulatory Visit: Payer: Self-pay | Admitting: Obstetrics and Gynecology

## 2022-10-18 DIAGNOSIS — Z803 Family history of malignant neoplasm of breast: Secondary | ICD-10-CM

## 2022-11-07 ENCOUNTER — Ambulatory Visit
Admission: RE | Admit: 2022-11-07 | Discharge: 2022-11-07 | Disposition: A | Discharge status: Home / Self Care | Payer: 59 | Source: Ambulatory Visit | Attending: Obstetrics and Gynecology | Admitting: Obstetrics and Gynecology

## 2022-11-07 DIAGNOSIS — Z803 Family history of malignant neoplasm of breast: Secondary | ICD-10-CM

## 2022-11-07 MED ORDER — GADOPICLENOL 0.5 MMOL/ML IV SOLN
7.5000 mL | Freq: Once | INTRAVENOUS | Status: AC | PRN
Start: 2022-11-07 — End: 2022-11-07
  Administered 2022-11-07: 7.5 mL via INTRAVENOUS

## 2022-11-09 ENCOUNTER — Ambulatory Visit (AMBULATORY_SURGERY_CENTER): Payer: 59

## 2022-11-09 VITALS — Ht 69.0 in | Wt 150.0 lb

## 2022-11-09 DIAGNOSIS — Z1211 Encounter for screening for malignant neoplasm of colon: Secondary | ICD-10-CM

## 2022-11-09 MED ORDER — NA SULFATE-K SULFATE-MG SULF 17.5-3.13-1.6 GM/177ML PO SOLN: 1.0000 | Freq: Once | ORAL | 0 refills | Status: AC

## 2022-11-09 NOTE — Progress Notes (Signed)

## 2022-11-10 ENCOUNTER — Encounter: Payer: 59 | Admitting: Gastroenterology

## 2022-12-05 ENCOUNTER — Encounter: Payer: Self-pay | Admitting: Certified Registered Nurse Anesthetist

## 2022-12-06 ENCOUNTER — Encounter: Payer: Self-pay | Admitting: Gastroenterology

## 2022-12-06 DIAGNOSIS — N3010 Interstitial cystitis (chronic) without hematuria: Secondary | ICD-10-CM | POA: Diagnosis not present

## 2022-12-07 ENCOUNTER — Encounter: Payer: Self-pay | Admitting: Gastroenterology

## 2022-12-07 ENCOUNTER — Ambulatory Visit (AMBULATORY_SURGERY_CENTER): Payer: BC Managed Care – PPO | Admitting: Gastroenterology

## 2022-12-07 VITALS — BP 108/64 | HR 67 | Temp 96.2°F | Resp 13 | Ht 69.0 in | Wt 150.0 lb

## 2022-12-07 DIAGNOSIS — Z1211 Encounter for screening for malignant neoplasm of colon: Secondary | ICD-10-CM

## 2022-12-07 MED ORDER — SODIUM CHLORIDE 0.9 % IV SOLN: 500.0000 mL | INTRAVENOUS | Status: DC

## 2022-12-07 NOTE — Progress Notes (Signed)
Report given to PACU, vss 

## 2022-12-07 NOTE — Progress Notes (Signed)
History and Physical:  This patient presents for endoscopic testing for: Encounter Diagnosis  Name Primary?   Special screening for malignant neoplasms, colon Yes    Avg risk for CRC - first screening exam Patient denies chronic abdominal pain, rectal bleeding, constipation or diarrhea.   Patient is otherwise without complaints or active issues today.   Past Medical History: Past Medical History:  Diagnosis Date   Allergy    Anxiety    Asthma    Heart murmur    as a child   Shortness of breath    during URI     Past Surgical History: Past Surgical History:  Procedure Laterality Date   CESAREAN SECTION      Allergies: Allergies  Allergen Reactions   Sulfa Antibiotics Anaphylaxis and Other (See Comments)    Patient does not recall reaction, but states that "it put her in hospital." could not walk became very weak    Macrobid [employer] Macro] Hives   Other Other (See Comments)   Penicillins Rash    Childhood reaction, pt is able to take ceftin Has patient had a PCN reaction causing immediate rash, facial/tongue/throat swelling, SOB or lightheadedness with hypotension: unknown Has patient had a PCN reaction causing severe rash involving mucus membranes or skin necrosis: unknown Has patient had a PCN reaction that required hospitalization unknown Has patient had a PCN reaction occurring within the last 10 years: no If all of the above answers are "NO", then may proceed with Cephalosporin use.     Outpatient Meds: Current Outpatient Medications  Medication Sig Dispense Refill   hydrOXYzine (ATARAX) 25 MG tablet Take 25 mg by mouth daily.     pentosan polysulfate (ELMIRON) 100 MG capsule Take 200 mg by mouth 2 (two) times daily.     albuterol (PROAIR HFA) 108 (90 Base) MCG/ACT inhaler prn Inhalation     Cetirizine HCl (ZYRTEC ALLERGY) 10 MG CAPS Take 10 mg by mouth daily. (Patient not taking: Reported on 11/09/2022)     EPINEPHrine 0.3 mg/0.3 mL IJ SOAJ  injection See admin instructions. (Patient not taking: Reported on 11/09/2022)     fluticasone furoate-vilanterol (BREO ELLIPTA) 100-25 MCG/INH AEPB Inhale 1 puff into the lungs daily.     ibuprofen (ADVIL) 800 MG tablet Take 800 mg by mouth as needed. (Patient not taking: Reported on 11/09/2022)     Current Facility-Administered Medications  Medication Dose Route Frequency Provider Last Rate Last Admin   0.9 %  sodium chloride infusion  500 mL Intravenous Continuous Danis, Estill Cotta III, MD          ___________________________________________________________________ Objective   Exam:  BP 117/67   Pulse 81   Temp (!) 96.2 F (35.7 C) (Temporal)   Ht 5\' 9"  (1.753 m)   Wt 150 lb (68 kg)   LMP 12/06/2022   SpO2 98%   BMI 22.15 kg/m   CV: regular , S1/S2 Resp: clear to auscultation bilaterally, normal RR and effort noted GI: soft, no tenderness, with active bowel sounds.   Assessment: Encounter Diagnosis  Name Primary?   Special screening for malignant neoplasms, colon Yes     Plan: Colonoscopy  The benefits and risks of the planned procedure were described in detail with the patient or (when appropriate) their health care proxy.  Risks were outlined as including, but not limited to, bleeding, infection, perforation, adverse medication reaction leading to cardiac or pulmonary decompensation, pancreatitis (if ERCP).  The limitation of incomplete mucosal visualization was also discussed.  No guarantees or warranties were given.    The patient is appropriate for an endoscopic procedure in the ambulatory setting.   - Wilfrid Lund, MD

## 2022-12-07 NOTE — Progress Notes (Signed)
Pt's states no medical or surgical changes since previsit or office visit. 

## 2022-12-07 NOTE — Patient Instructions (Signed)
Please read handouts provided. Continue present medications. Repeat colonoscopy in 10 years for screening.   YOU HAD AN ENDOSCOPIC PROCEDURE TODAY AT THE [facility]:   Refer to the procedure report that was given to you for any specific questions about what was found during the examination.  If the procedure report does not answer your questions, please call your gastroenterologist to clarify.  If you requested that your care partner not be given the details of your procedure findings, then the procedure report has been included in a sealed envelope for you to review at your convenience later.  YOU SHOULD EXPECT: Some feelings of bloating in the abdomen. Passage of more gas than usual.  Walking can help get rid of the air that was put into your GI tract during the procedure and reduce the bloating. If you had a lower endoscopy (such as a colonoscopy or flexible sigmoidoscopy) you may notice spotting of blood in your stool or on the toilet paper. If you underwent a bowel prep for your procedure, you may not have a normal bowel movement for a few days.  Please Note:  You might notice some irritation and congestion in your nose or some drainage.  This is from the oxygen used during your procedure.  There is no need for concern and it should clear up in a day or so.  SYMPTOMS TO REPORT IMMEDIATELY:  Following lower endoscopy (colonoscopy or flexible sigmoidoscopy):  Excessive amounts of blood in the stool  Significant tenderness or worsening of abdominal pains  Swelling of the abdomen that is new, acute  Fever of 100F or higher.  For urgent or emergent issues, a gastroenterologist can be reached at any hour by calling [phone]. Do not use MyChart messaging for urgent concerns.    DIET:  We do recommend a small meal at first, but then you may proceed to your regular diet.  Drink plenty of fluids but you should avoid alcoholic beverages for 24 hours.  ACTIVITY:  You should  plan to take it easy for the rest of today and you should NOT DRIVE or use heavy machinery until tomorrow (because of the sedation medicines used during the test).    FOLLOW UP: Our staff will call the number listed on your records the next business day following your procedure.  We will call around 7:15- 8:00 am to check on you and address any questions or concerns that you may have regarding the information given to you following your procedure. If we do not reach you, we will leave a message.     If any biopsies were taken you will be contacted by phone or by letter within the next 1-3 weeks.  Please call us at [phone] if you have not heard about the biopsies in 3 weeks.    SIGNATURES/CONFIDENTIALITY: You and/or your care partner have signed paperwork which will be entered into your electronic medical record.  These signatures attest to the fact that that the information above on your After Visit Summary has been reviewed and is understood.  Full responsibility of the confidentiality of this discharge information lies with you and/or your care-partner. 

## 2022-12-07 NOTE — Op Note (Signed)
[facility] Patient Name: Alyssa Perez Procedure Date: 12/07/2022 9:07 AM MRN: [id number] Endoscopist: Mallie Mussel L. Loletha Carrow , MD, [id number] Age: 47 Referring MD:  Date of Birth: 1976-01-25 Gender: Female Account #: [id number] Procedure:                Colonoscopy Indications:              Screening for colorectal malignant neoplasm, This                            is the patient's first colonoscopy Medicines:                Monitored Anesthesia Care Procedure:                Pre-Anesthesia Assessment:                           - Prior to the procedure, a History and Physical                            was performed, and patient medications and                            allergies were reviewed. The patient's tolerance of                            previous anesthesia was also reviewed. The risks                            and benefits of the procedure and the sedation                            options and risks were discussed with the patient.                            All questions were answered, and informed consent                            was obtained. Prior Anticoagulants: The patient has                            taken no anticoagulant or antiplatelet agents. ASA                            Grade Assessment: II - A patient with mild systemic                            disease. After reviewing the risks and benefits,                            the patient was deemed in satisfactory condition to                            undergo the procedure.  After obtaining informed consent, the colonoscope                            was passed under direct vision. Throughout the                            procedure, the patient's blood pressure, pulse, and                            oxygen saturations were monitored continuously. The                            Olympus PCF-H190DL (#[id number]) Colonoscope was                            introduced through the  anus and advanced to the the                            terminal ileum, with identification of the                            appendiceal orifice and IC valve. The colonoscopy                            was somewhat difficult due to a redundant colon.                            Successful completion of the procedure was aided by                            using manual pressure and straightening and                            shortening the scope to obtain bowel loop reduction                            (adult scope for next exam). The patient tolerated                            the procedure well. The quality of the bowel                            preparation was excellent. The terminal ileum,                            ileocecal valve, appendiceal orifice, and rectum                            were photographed. The bowel preparation used was                            SUPREP via split dose instruction. Scope In: 9:11:08 AM Scope Out: 9:26:10 AM Scope Withdrawal Time: 0 hours 9 minutes 55 seconds  Total Procedure Duration: 0 hours 15 minutes 2 seconds  Findings:                 The perianal and digital rectal examinations were                            normal.                           The terminal ileum appeared normal.                           Repeat examination of right colon under NBI                            performed.                           The entire examined colon appeared normal on direct                            and retroflexion views. Complications:            No immediate complications. Estimated Blood Loss:     Estimated blood loss: none. Impression:               - The examined portion of the ileum was normal.                           - The entire examined colon is normal on direct and                            retroflexion views.                           - No specimens collected. Recommendation:           - Patient has a contact number available for                             emergencies. The signs and symptoms of potential                            delayed complications were discussed with the                            patient. Return to normal activities tomorrow.                            Written discharge instructions were provided to the                            patient.                           - Resume previous diet.                           -  Continue present medications.                           - Repeat colonoscopy in 10 years for screening                            purposes. Jacorey Donaway L. Myrtie Neither, MD 12/07/2022 9:36:18 AM This report has been signed electronically.

## 2022-12-08 ENCOUNTER — Telehealth: Payer: Self-pay | Admitting: *Deleted

## 2022-12-08 NOTE — Telephone Encounter (Signed)
Attempted f/u phone call. No answer. Left message. °

## 2022-12-19 ENCOUNTER — Other Ambulatory Visit (HOSPITAL_COMMUNITY): Payer: Self-pay | Admitting: Registered Nurse

## 2022-12-19 ENCOUNTER — Encounter (HOSPITAL_BASED_OUTPATIENT_CLINIC_OR_DEPARTMENT_OTHER): Payer: Self-pay

## 2022-12-19 ENCOUNTER — Ambulatory Visit (HOSPITAL_BASED_OUTPATIENT_CLINIC_OR_DEPARTMENT_OTHER)
Admission: RE | Admit: 2022-12-19 | Discharge: 2022-12-19 | Disposition: A | Discharge status: Home / Self Care | Payer: BC Managed Care – PPO | Source: Ambulatory Visit | Attending: Registered Nurse | Admitting: Registered Nurse

## 2022-12-19 DIAGNOSIS — R002 Palpitations: Secondary | ICD-10-CM

## 2022-12-19 DIAGNOSIS — J309 Allergic rhinitis, unspecified: Secondary | ICD-10-CM | POA: Diagnosis not present

## 2022-12-19 DIAGNOSIS — R0789 Other chest pain: Secondary | ICD-10-CM | POA: Diagnosis not present

## 2022-12-19 DIAGNOSIS — R0602 Shortness of breath: Secondary | ICD-10-CM

## 2022-12-19 DIAGNOSIS — R509 Fever, unspecified: Secondary | ICD-10-CM | POA: Diagnosis not present

## 2022-12-19 DIAGNOSIS — J029 Acute pharyngitis, unspecified: Secondary | ICD-10-CM | POA: Diagnosis not present

## 2022-12-19 DIAGNOSIS — R051 Acute cough: Secondary | ICD-10-CM | POA: Diagnosis not present

## 2022-12-19 DIAGNOSIS — R0981 Nasal congestion: Secondary | ICD-10-CM | POA: Diagnosis not present

## 2022-12-19 DIAGNOSIS — Z1152 Encounter for screening for COVID-19: Secondary | ICD-10-CM | POA: Diagnosis not present

## 2022-12-19 MED ORDER — IOHEXOL 350 MG/ML SOLN
100.0000 mL | Freq: Once | INTRAVENOUS | Status: AC | PRN
  Administered 2022-12-19: 60 mL via INTRAVENOUS

## 2022-12-23 DIAGNOSIS — R0602 Shortness of breath: Secondary | ICD-10-CM | POA: Diagnosis not present

## 2022-12-23 DIAGNOSIS — R002 Palpitations: Secondary | ICD-10-CM | POA: Diagnosis not present

## 2022-12-23 DIAGNOSIS — R197 Diarrhea, unspecified: Secondary | ICD-10-CM | POA: Diagnosis not present

## 2022-12-23 DIAGNOSIS — R509 Fever, unspecified: Secondary | ICD-10-CM | POA: Diagnosis not present

## 2022-12-23 DIAGNOSIS — R051 Acute cough: Secondary | ICD-10-CM | POA: Diagnosis not present

## 2022-12-23 DIAGNOSIS — Z1152 Encounter for screening for COVID-19: Secondary | ICD-10-CM | POA: Diagnosis not present

## 2022-12-23 DIAGNOSIS — J309 Allergic rhinitis, unspecified: Secondary | ICD-10-CM | POA: Diagnosis not present

## 2022-12-23 DIAGNOSIS — R5383 Other fatigue: Secondary | ICD-10-CM | POA: Diagnosis not present

## 2022-12-23 DIAGNOSIS — H9313 Tinnitus, bilateral: Secondary | ICD-10-CM | POA: Diagnosis not present

## 2023-01-12 DIAGNOSIS — H04222 Epiphora due to insufficient drainage, left lacrimal gland: Secondary | ICD-10-CM | POA: Diagnosis not present

## 2023-01-12 DIAGNOSIS — Z79899 Other long term (current) drug therapy: Secondary | ICD-10-CM | POA: Diagnosis not present

## 2023-01-12 DIAGNOSIS — H5203 Hypermetropia, bilateral: Secondary | ICD-10-CM | POA: Diagnosis not present

## 2023-01-12 DIAGNOSIS — H524 Presbyopia: Secondary | ICD-10-CM | POA: Diagnosis not present

## 2023-04-03 DIAGNOSIS — Z6822 Body mass index (BMI) 22.0-22.9, adult: Secondary | ICD-10-CM | POA: Diagnosis not present

## 2023-04-03 DIAGNOSIS — Z1231 Encounter for screening mammogram for malignant neoplasm of breast: Secondary | ICD-10-CM | POA: Diagnosis not present

## 2023-04-03 DIAGNOSIS — Z124 Encounter for screening for malignant neoplasm of cervix: Secondary | ICD-10-CM | POA: Diagnosis not present

## 2023-04-03 DIAGNOSIS — Z01419 Encounter for gynecological examination (general) (routine) without abnormal findings: Secondary | ICD-10-CM | POA: Diagnosis not present

## 2023-04-06 ENCOUNTER — Other Ambulatory Visit: Payer: Self-pay | Admitting: Obstetrics and Gynecology

## 2023-04-06 DIAGNOSIS — Z9189 Other specified personal risk factors, not elsewhere classified: Secondary | ICD-10-CM

## 2023-05-02 DIAGNOSIS — D2272 Melanocytic nevi of left lower limb, including hip: Secondary | ICD-10-CM | POA: Diagnosis not present

## 2023-05-02 DIAGNOSIS — L821 Other seborrheic keratosis: Secondary | ICD-10-CM | POA: Diagnosis not present

## 2023-05-02 DIAGNOSIS — D225 Melanocytic nevi of trunk: Secondary | ICD-10-CM | POA: Diagnosis not present

## 2023-05-02 DIAGNOSIS — D2271 Melanocytic nevi of right lower limb, including hip: Secondary | ICD-10-CM | POA: Diagnosis not present

## 2023-07-18 DIAGNOSIS — N644 Mastodynia: Secondary | ICD-10-CM | POA: Diagnosis not present

## 2023-07-26 DIAGNOSIS — Z880 Allergy status to penicillin: Secondary | ICD-10-CM | POA: Diagnosis not present

## 2023-07-26 DIAGNOSIS — L501 Idiopathic urticaria: Secondary | ICD-10-CM | POA: Diagnosis not present

## 2023-07-26 DIAGNOSIS — K219 Gastro-esophageal reflux disease without esophagitis: Secondary | ICD-10-CM | POA: Diagnosis not present

## 2023-07-26 DIAGNOSIS — J4530 Mild persistent asthma, uncomplicated: Secondary | ICD-10-CM | POA: Diagnosis not present

## 2023-08-10 DIAGNOSIS — J4541 Moderate persistent asthma with (acute) exacerbation: Secondary | ICD-10-CM | POA: Diagnosis not present

## 2023-08-10 DIAGNOSIS — R531 Weakness: Secondary | ICD-10-CM | POA: Diagnosis not present

## 2023-08-10 DIAGNOSIS — R058 Other specified cough: Secondary | ICD-10-CM | POA: Diagnosis not present

## 2023-09-12 DIAGNOSIS — Z0001 Encounter for general adult medical examination with abnormal findings: Secondary | ICD-10-CM | POA: Diagnosis not present

## 2023-09-12 DIAGNOSIS — Z0189 Encounter for other specified special examinations: Secondary | ICD-10-CM | POA: Diagnosis not present

## 2023-09-12 DIAGNOSIS — Z1212 Encounter for screening for malignant neoplasm of rectum: Secondary | ICD-10-CM | POA: Diagnosis not present

## 2023-09-12 DIAGNOSIS — D696 Thrombocytopenia, unspecified: Secondary | ICD-10-CM | POA: Diagnosis not present

## 2023-09-18 DIAGNOSIS — Z0000 Encounter for general adult medical examination without abnormal findings: Secondary | ICD-10-CM | POA: Diagnosis not present

## 2023-09-18 DIAGNOSIS — Z23 Encounter for immunization: Secondary | ICD-10-CM | POA: Diagnosis not present

## 2023-09-18 DIAGNOSIS — D696 Thrombocytopenia, unspecified: Secondary | ICD-10-CM | POA: Diagnosis not present

## 2023-09-18 DIAGNOSIS — R82998 Other abnormal findings in urine: Secondary | ICD-10-CM | POA: Diagnosis not present

## 2023-10-04 ENCOUNTER — Other Ambulatory Visit: Payer: BC Managed Care – PPO

## 2023-10-06 ENCOUNTER — Other Ambulatory Visit: Payer: BC Managed Care – PPO

## 2023-11-09 ENCOUNTER — Ambulatory Visit
Admission: RE | Admit: 2023-11-09 | Discharge: 2023-11-09 | Disposition: A | Discharge status: Home / Self Care | Payer: BC Managed Care – PPO | Source: Ambulatory Visit | Attending: Obstetrics and Gynecology | Admitting: Obstetrics and Gynecology

## 2023-11-09 DIAGNOSIS — Z9189 Other specified personal risk factors, not elsewhere classified: Secondary | ICD-10-CM

## 2023-11-09 DIAGNOSIS — R92323 Mammographic fibroglandular density, bilateral breasts: Secondary | ICD-10-CM | POA: Diagnosis not present

## 2023-11-09 DIAGNOSIS — Z803 Family history of malignant neoplasm of breast: Secondary | ICD-10-CM | POA: Diagnosis not present

## 2023-11-09 MED ORDER — GADOPICLENOL 0.5 MMOL/ML IV SOLN
7.0000 mL | Freq: Once | INTRAVENOUS | Status: AC | PRN
  Administered 2023-11-09: 7 mL via INTRAVENOUS

## 2023-11-12 ENCOUNTER — Other Ambulatory Visit: Payer: BC Managed Care – PPO

## 2023-12-12 DIAGNOSIS — N3010 Interstitial cystitis (chronic) without hematuria: Secondary | ICD-10-CM | POA: Diagnosis not present

## 2024-03-27 DIAGNOSIS — H5203 Hypermetropia, bilateral: Secondary | ICD-10-CM | POA: Diagnosis not present

## 2024-03-27 DIAGNOSIS — Z79899 Other long term (current) drug therapy: Secondary | ICD-10-CM | POA: Diagnosis not present

## 2024-04-02 DIAGNOSIS — N3010 Interstitial cystitis (chronic) without hematuria: Secondary | ICD-10-CM | POA: Diagnosis not present

## 2024-04-02 DIAGNOSIS — R140 Abdominal distension (gaseous): Secondary | ICD-10-CM | POA: Diagnosis not present

## 2024-04-29 DIAGNOSIS — N3010 Interstitial cystitis (chronic) without hematuria: Secondary | ICD-10-CM | POA: Diagnosis not present

## 2024-05-13 DIAGNOSIS — N3010 Interstitial cystitis (chronic) without hematuria: Secondary | ICD-10-CM | POA: Diagnosis not present

## 2024-05-21 DIAGNOSIS — Z6823 Body mass index (BMI) 23.0-23.9, adult: Secondary | ICD-10-CM | POA: Diagnosis not present

## 2024-05-21 DIAGNOSIS — Z124 Encounter for screening for malignant neoplasm of cervix: Secondary | ICD-10-CM | POA: Diagnosis not present

## 2024-05-21 DIAGNOSIS — Z01419 Encounter for gynecological examination (general) (routine) without abnormal findings: Secondary | ICD-10-CM | POA: Diagnosis not present

## 2024-05-21 DIAGNOSIS — Z1231 Encounter for screening mammogram for malignant neoplasm of breast: Secondary | ICD-10-CM | POA: Diagnosis not present

## 2024-08-01 DIAGNOSIS — H04129 Dry eye syndrome of unspecified lacrimal gland: Secondary | ICD-10-CM | POA: Diagnosis not present

## 2024-08-01 DIAGNOSIS — J4530 Mild persistent asthma, uncomplicated: Secondary | ICD-10-CM | POA: Diagnosis not present

## 2024-08-01 DIAGNOSIS — J301 Allergic rhinitis due to pollen: Secondary | ICD-10-CM | POA: Diagnosis not present

## 2024-08-01 DIAGNOSIS — N3010 Interstitial cystitis (chronic) without hematuria: Secondary | ICD-10-CM | POA: Diagnosis not present

## 2024-08-22 DIAGNOSIS — N951 Menopausal and female climacteric states: Secondary | ICD-10-CM | POA: Diagnosis not present

## 2024-08-22 DIAGNOSIS — F411 Generalized anxiety disorder: Secondary | ICD-10-CM | POA: Diagnosis not present

## 2024-08-23 ENCOUNTER — Ambulatory Visit

## 2024-08-23 DIAGNOSIS — Z23 Encounter for immunization: Secondary | ICD-10-CM

## 2024-09-13 DIAGNOSIS — Z1212 Encounter for screening for malignant neoplasm of rectum: Secondary | ICD-10-CM | POA: Diagnosis not present

## 2024-09-13 DIAGNOSIS — Z0189 Encounter for other specified special examinations: Secondary | ICD-10-CM | POA: Diagnosis not present

## 2024-09-16 DIAGNOSIS — Z79899 Other long term (current) drug therapy: Secondary | ICD-10-CM | POA: Diagnosis not present

## 2024-09-16 DIAGNOSIS — R5382 Chronic fatigue, unspecified: Secondary | ICD-10-CM | POA: Diagnosis not present

## 2024-09-16 DIAGNOSIS — D696 Thrombocytopenia, unspecified: Secondary | ICD-10-CM | POA: Diagnosis not present

## 2024-09-19 DIAGNOSIS — Z0000 Encounter for general adult medical examination without abnormal findings: Secondary | ICD-10-CM | POA: Diagnosis not present

## 2024-09-19 DIAGNOSIS — D696 Thrombocytopenia, unspecified: Secondary | ICD-10-CM | POA: Diagnosis not present

## 2024-09-19 DIAGNOSIS — Z1331 Encounter for screening for depression: Secondary | ICD-10-CM | POA: Diagnosis not present

## 2024-11-12 ENCOUNTER — Other Ambulatory Visit: Payer: Self-pay | Admitting: Obstetrics and Gynecology

## 2024-11-12 DIAGNOSIS — Z803 Family history of malignant neoplasm of breast: Secondary | ICD-10-CM

## 2024-11-15 ENCOUNTER — Other Ambulatory Visit: Payer: Self-pay | Admitting: Obstetrics and Gynecology

## 2024-11-15 DIAGNOSIS — Z803 Family history of malignant neoplasm of breast: Secondary | ICD-10-CM

## 2024-12-15 ENCOUNTER — Other Ambulatory Visit

## 2024-12-18 ENCOUNTER — Other Ambulatory Visit

## 2024-12-30 ENCOUNTER — Other Ambulatory Visit
